# Patient Record
Sex: Male | Born: 1955 | Race: Black or African American | Hispanic: No | Marital: Married | Smoking: Former smoker
Health system: Southern US, Community
[De-identification: ages and names within clinical notes are randomized; demographics above are authoritative.]

## PROBLEM LIST (undated history)

## (undated) DIAGNOSIS — I10 Essential (primary) hypertension: Secondary | ICD-10-CM

## (undated) DIAGNOSIS — E7800 Pure hypercholesterolemia, unspecified: Secondary | ICD-10-CM

## (undated) DIAGNOSIS — K219 Gastro-esophageal reflux disease without esophagitis: Secondary | ICD-10-CM

## (undated) DIAGNOSIS — Z9889 Other specified postprocedural states: Secondary | ICD-10-CM

## (undated) DIAGNOSIS — J449 Chronic obstructive pulmonary disease, unspecified: Secondary | ICD-10-CM

## (undated) DIAGNOSIS — J40 Bronchitis, not specified as acute or chronic: Secondary | ICD-10-CM

## (undated) HISTORY — DX: Other specified postprocedural states: Z98.890

## (undated) HISTORY — PX: OTHER SURGICAL HISTORY: SHX169

---

## 2002-06-23 ENCOUNTER — Encounter: Payer: Self-pay | Admitting: Emergency Medicine

## 2002-06-23 ENCOUNTER — Emergency Department (HOSPITAL_COMMUNITY): Admission: EM | Admit: 2002-06-23 | Discharge: 2002-06-23 | Payer: Self-pay | Admitting: Emergency Medicine

## 2002-11-10 ENCOUNTER — Emergency Department (HOSPITAL_COMMUNITY): Admission: EM | Admit: 2002-11-10 | Discharge: 2002-11-10 | Payer: Self-pay | Admitting: Emergency Medicine

## 2002-11-10 ENCOUNTER — Encounter: Payer: Self-pay | Admitting: Emergency Medicine

## 2004-09-20 ENCOUNTER — Inpatient Hospital Stay (HOSPITAL_COMMUNITY): Admission: EM | Admit: 2004-09-20 | Discharge: 2004-09-23 | Payer: Self-pay | Admitting: *Deleted

## 2006-05-02 ENCOUNTER — Inpatient Hospital Stay (HOSPITAL_COMMUNITY): Admission: EM | Admit: 2006-05-02 | Discharge: 2006-05-04 | Payer: Self-pay | Admitting: Emergency Medicine

## 2006-07-22 ENCOUNTER — Inpatient Hospital Stay (HOSPITAL_COMMUNITY): Admission: EM | Admit: 2006-07-22 | Discharge: 2006-07-24 | Payer: Self-pay | Admitting: Emergency Medicine

## 2006-10-22 ENCOUNTER — Inpatient Hospital Stay (HOSPITAL_COMMUNITY): Admission: EM | Admit: 2006-10-22 | Discharge: 2006-10-25 | Payer: Self-pay | Admitting: Emergency Medicine

## 2006-11-12 ENCOUNTER — Inpatient Hospital Stay (HOSPITAL_COMMUNITY): Admission: EM | Admit: 2006-11-12 | Discharge: 2006-11-14 | Payer: Self-pay | Admitting: Emergency Medicine

## 2007-01-27 ENCOUNTER — Inpatient Hospital Stay (HOSPITAL_COMMUNITY): Admission: EM | Admit: 2007-01-27 | Discharge: 2007-01-29 | Payer: Self-pay | Admitting: Emergency Medicine

## 2010-03-12 ENCOUNTER — Inpatient Hospital Stay (HOSPITAL_COMMUNITY): Admission: EM | Admit: 2010-03-12 | Discharge: 2010-03-14 | Payer: Self-pay | Source: Home / Self Care

## 2010-04-04 ENCOUNTER — Ambulatory Visit (HOSPITAL_COMMUNITY)
Admission: RE | Admit: 2010-04-04 | Discharge: 2010-04-04 | Payer: Self-pay | Source: Home / Self Care | Attending: Internal Medicine | Admitting: Internal Medicine

## 2010-05-20 ENCOUNTER — Emergency Department (HOSPITAL_COMMUNITY): Payer: Medicare Other

## 2010-05-20 ENCOUNTER — Emergency Department (HOSPITAL_COMMUNITY)
Admission: EM | Admit: 2010-05-20 | Discharge: 2010-05-20 | Disposition: A | Discharge status: Home / Self Care | Payer: Medicare Other | Attending: Emergency Medicine | Admitting: Emergency Medicine

## 2010-05-20 DIAGNOSIS — F17200 Nicotine dependence, unspecified, uncomplicated: Secondary | ICD-10-CM | POA: Insufficient documentation

## 2010-05-20 DIAGNOSIS — J449 Chronic obstructive pulmonary disease, unspecified: Secondary | ICD-10-CM | POA: Insufficient documentation

## 2010-05-20 DIAGNOSIS — J4489 Other specified chronic obstructive pulmonary disease: Secondary | ICD-10-CM | POA: Insufficient documentation

## 2010-05-20 LAB — DIFFERENTIAL
Basophils Absolute: 0.1 10*3/uL (ref 0.0–0.1)
Basophils Relative: 1 % (ref 0–1)
Eosinophils Absolute: 1.2 10*3/uL — ABNORMAL HIGH (ref 0.0–0.7)
Eosinophils Relative: 20 % — ABNORMAL HIGH (ref 0–5)
Lymphocytes Relative: 30 % (ref 12–46)
Lymphs Abs: 1.7 10*3/uL (ref 0.7–4.0)
Monocytes Absolute: 0.6 10*3/uL (ref 0.1–1.0)
Monocytes Relative: 10 % (ref 3–12)
Neutro Abs: 2.2 10*3/uL (ref 1.7–7.7)
Neutrophils Relative %: 39 % — ABNORMAL LOW (ref 43–77)

## 2010-05-20 LAB — CBC
HCT: 41 % (ref 39.0–52.0)
Hemoglobin: 14 g/dL (ref 13.0–17.0)
MCH: 30 pg (ref 26.0–34.0)
MCHC: 34.1 g/dL (ref 30.0–36.0)
MCV: 87.8 fL (ref 78.0–100.0)
Platelets: 240 10*3/uL (ref 150–400)
RBC: 4.67 MIL/uL (ref 4.22–5.81)
RDW: 13.8 % (ref 11.5–15.5)
WBC: 5.7 10*3/uL (ref 4.0–10.5)

## 2010-05-29 ENCOUNTER — Emergency Department (HOSPITAL_COMMUNITY)
Admission: EM | Admit: 2010-05-29 | Discharge: 2010-05-29 | Disposition: A | Discharge status: Home / Self Care | Payer: Medicare Other | Attending: Emergency Medicine | Admitting: Emergency Medicine

## 2010-05-29 DIAGNOSIS — R0609 Other forms of dyspnea: Secondary | ICD-10-CM | POA: Insufficient documentation

## 2010-05-29 DIAGNOSIS — J449 Chronic obstructive pulmonary disease, unspecified: Secondary | ICD-10-CM | POA: Insufficient documentation

## 2010-05-29 DIAGNOSIS — J4489 Other specified chronic obstructive pulmonary disease: Secondary | ICD-10-CM | POA: Insufficient documentation

## 2010-05-29 DIAGNOSIS — R05 Cough: Secondary | ICD-10-CM | POA: Insufficient documentation

## 2010-05-29 DIAGNOSIS — R0602 Shortness of breath: Secondary | ICD-10-CM | POA: Insufficient documentation

## 2010-05-29 DIAGNOSIS — Y92009 Unspecified place in unspecified non-institutional (private) residence as the place of occurrence of the external cause: Secondary | ICD-10-CM | POA: Insufficient documentation

## 2010-05-29 DIAGNOSIS — R059 Cough, unspecified: Secondary | ICD-10-CM | POA: Insufficient documentation

## 2010-05-29 DIAGNOSIS — R0989 Other specified symptoms and signs involving the circulatory and respiratory systems: Secondary | ICD-10-CM | POA: Insufficient documentation

## 2010-05-29 DIAGNOSIS — J705 Respiratory conditions due to smoke inhalation: Secondary | ICD-10-CM | POA: Insufficient documentation

## 2010-05-29 DIAGNOSIS — X001XXA Exposure to smoke in uncontrolled fire in building or structure, initial encounter: Secondary | ICD-10-CM | POA: Insufficient documentation

## 2010-06-05 LAB — CBC
HCT: 34.4 % — ABNORMAL LOW (ref 39.0–52.0)
HCT: 41.3 % (ref 39.0–52.0)
Hemoglobin: 12.2 g/dL — ABNORMAL LOW (ref 13.0–17.0)
Hemoglobin: 14.8 g/dL (ref 13.0–17.0)
MCH: 31.3 pg (ref 26.0–34.0)
MCH: 32 pg (ref 26.0–34.0)
MCHC: 35.5 g/dL (ref 30.0–36.0)
MCHC: 35.8 g/dL (ref 30.0–36.0)
MCV: 88.2 fL (ref 78.0–100.0)
MCV: 89.4 fL (ref 78.0–100.0)
Platelets: 183 10*3/uL (ref 150–400)
Platelets: 260 10*3/uL (ref 150–400)
RBC: 3.9 MIL/uL — ABNORMAL LOW (ref 4.22–5.81)
RBC: 4.62 MIL/uL (ref 4.22–5.81)
RDW: 13 % (ref 11.5–15.5)
RDW: 13 % (ref 11.5–15.5)
WBC: 11.8 10*3/uL — ABNORMAL HIGH (ref 4.0–10.5)
WBC: 8.3 10*3/uL (ref 4.0–10.5)

## 2010-06-05 LAB — DIFFERENTIAL
Basophils Absolute: 0 10*3/uL (ref 0.0–0.1)
Basophils Absolute: 0.1 10*3/uL (ref 0.0–0.1)
Basophils Relative: 0 % (ref 0–1)
Basophils Relative: 2 % — ABNORMAL HIGH (ref 0–1)
Eosinophils Absolute: 0 10*3/uL (ref 0.0–0.7)
Eosinophils Absolute: 1.6 10*3/uL — ABNORMAL HIGH (ref 0.0–0.7)
Eosinophils Relative: 0 % (ref 0–5)
Eosinophils Relative: 19 % — ABNORMAL HIGH (ref 0–5)
Lymphocytes Relative: 36 % (ref 12–46)
Lymphocytes Relative: 7 % — ABNORMAL LOW (ref 12–46)
Lymphs Abs: 0.8 10*3/uL (ref 0.7–4.0)
Lymphs Abs: 3 10*3/uL (ref 0.7–4.0)
Monocytes Absolute: 0.5 10*3/uL (ref 0.1–1.0)
Monocytes Absolute: 0.8 10*3/uL (ref 0.1–1.0)
Monocytes Relative: 6 % (ref 3–12)
Monocytes Relative: 7 % (ref 3–12)
Neutro Abs: 10.2 10*3/uL — ABNORMAL HIGH (ref 1.7–7.7)
Neutro Abs: 3.1 10*3/uL (ref 1.7–7.7)
Neutrophils Relative %: 37 % — ABNORMAL LOW (ref 43–77)
Neutrophils Relative %: 86 % — ABNORMAL HIGH (ref 43–77)

## 2010-06-05 LAB — BASIC METABOLIC PANEL
BUN: 10 mg/dL (ref 6–23)
BUN: 13 mg/dL (ref 6–23)
CO2: 22 mEq/L (ref 19–32)
CO2: 24 mEq/L (ref 19–32)
Calcium: 9 mg/dL (ref 8.4–10.5)
Calcium: 9.5 mg/dL (ref 8.4–10.5)
Chloride: 101 mEq/L (ref 96–112)
Chloride: 109 mEq/L (ref 96–112)
Creatinine, Ser: 1.08 mg/dL (ref 0.4–1.5)
Creatinine, Ser: 1.22 mg/dL (ref 0.4–1.5)
GFR calc non Af Amer: >60 mL/min (ref >60)
GFR calc non Af Amer: >60 mL/min (ref >60)
Glucose, Bld: 127 mg/dL — ABNORMAL HIGH (ref 70–99)
Glucose, Bld: 76 mg/dL (ref 70–99)
Potassium: 3.8 mEq/L (ref 3.5–5.1)
Potassium: 4 mEq/L (ref 3.5–5.1)
Sodium: 138 mEq/L (ref 135–145)
Sodium: 139 mEq/L (ref 135–145)

## 2010-06-05 LAB — POCT CARDIAC MARKERS
CKMB, poc: 1.5 ng/mL (ref 1.0–8.0)
Myoglobin, poc: 47 ng/mL (ref 12–200)
Troponin i, poc: <0.05 ng/mL (ref 0.00–0.09)

## 2010-06-05 LAB — BLOOD GAS, ARTERIAL
Acid-base deficit: 0.9 mmol/L (ref 0.0–2.0)
Bicarbonate: 23.8 mEq/L (ref 20.0–24.0)
FIO2: 0.4 %
O2 Saturation: 97.6 %
Patient temperature: 37
TCO2: 20.9 mmol/L (ref 0–100)
pCO2 arterial: 43.4 mmHg (ref 35.0–45.0)
pH, Arterial: 7.358 (ref 7.350–7.450)
pO2, Arterial: 104 mmHg — ABNORMAL HIGH (ref 80.0–100.0)

## 2010-06-05 LAB — CARBOXYHEMOGLOBIN
Carboxyhemoglobin: 1.9 % — ABNORMAL HIGH (ref 0.5–1.5)
Methemoglobin: 1 % (ref 0.0–1.5)
O2 Saturation: 94.9 %
Total hemoglobin: 14.5 g/dL (ref 13.5–18.0)
Total oxygen content: 18.8 mL/dL (ref 15.0–23.0)

## 2010-07-20 ENCOUNTER — Ambulatory Visit (HOSPITAL_COMMUNITY)
Admission: AD | Admit: 2010-07-20 | Discharge: 2010-07-21 | Disposition: A | Discharge status: Home / Self Care | Payer: Medicare Other | Source: Ambulatory Visit | Attending: Internal Medicine | Admitting: Internal Medicine

## 2010-07-20 DIAGNOSIS — K222 Esophageal obstruction: Secondary | ICD-10-CM | POA: Insufficient documentation

## 2010-07-20 DIAGNOSIS — T18108A Unspecified foreign body in esophagus causing other injury, initial encounter: Secondary | ICD-10-CM | POA: Insufficient documentation

## 2010-07-20 DIAGNOSIS — IMO0002 Reserved for concepts with insufficient information to code with codable children: Secondary | ICD-10-CM | POA: Insufficient documentation

## 2010-07-21 DIAGNOSIS — T18108A Unspecified foreign body in esophagus causing other injury, initial encounter: Secondary | ICD-10-CM

## 2010-07-21 DIAGNOSIS — R1310 Dysphagia, unspecified: Secondary | ICD-10-CM

## 2010-07-21 DIAGNOSIS — IMO0002 Reserved for concepts with insufficient information to code with codable children: Secondary | ICD-10-CM

## 2010-07-21 DIAGNOSIS — K222 Esophageal obstruction: Secondary | ICD-10-CM

## 2010-07-21 DIAGNOSIS — Z9889 Other specified postprocedural states: Secondary | ICD-10-CM

## 2010-07-21 HISTORY — DX: Other specified postprocedural states: Z98.890

## 2010-07-25 ENCOUNTER — Encounter: Payer: Self-pay | Admitting: Internal Medicine

## 2010-07-25 NOTE — Op Note (Signed)
  NAME:  [name], Sankalp                  ACCOUNT NO.:  [id number]  MEDICAL RECORD NO.:  [id number]           PATIENT TYPE:  O  LOCATION:  DAYP                          FACILITY:  [facility]  PHYSICIAN:  R. Roetta Sessions, M.D. DATE OF BIRTH:  [birth year]  DATE OF PROCEDURE:  07/21/2010 DATE OF DISCHARGE:                              OPERATIVE REPORT   INDICATIONS FOR PROCEDURE:  A 55 year old gentleman with intermittent esophageal dysphagia to solids, ate some meat at approximately 2200 on July 20, 2010, beginning of a late evening meal.  He felt the meat bolus stick.  He was unable to swallow anything including saliva.  Since that time he presented to the ED, he saw Dr. Colon Branch.  She gave him 1 mg of glucagon IV.  This did not help.  She called me.  EGD is now being done for probable food impaction.  Risks, benefits, limitations, alternatives, and imponderables have been discussed, questions answered. Please see the documentation in the medical record.  He has had no prior GI evaluation for his dysphagia symptoms.  PROCEDURE NOTE:  O2 saturation, blood pressure, pulse, and respirations were monitored throughout entire procedure.  CONSCIOUS SEDATION:  Versed 2 mg IV, Demerol 50 mg IV in single dose. Cetacaine spray gargled and sparyed.  INSTRUMENT:  Pentax video chip system.  FINDINGS:  Examination of tubular esophagus revealed a meat bolus ball valving distal esophagus.  I approximated the gastroscope against the bolus and gently applied pressure and it easily popped into the stomach. It was impacted against Schatzki ring.  Complete examination was not attempted and the patient tolerated brief procedure very well.  IMPRESSION:  Emergency esophagogastroduodenoscopy with removal of esophageal food impaction as described above.  Underlying lesion appeared to be a Schatzki ring.  Exam limited as described above.  RECOMMENDATIONS: 1. Soft diet.  Avoid meat for now. 2. Begin Protonix 40 mg  orally daily. 3. Plan to bring him back in the next 1-2 weeks for elective EGD with     dilation, etc. as appropriate.     Jonathon Bellows, M.D.     RMR/MEDQ  D:  07/21/2010  T:  07/21/2010  Job:  [id number]  cc:   Nicoletta Dress. Colon Branch, M.D. Fax: [phone]  Electronically Signed by Lorrin Goodell M.D. on 07/25/2010 07:52:13 PM

## 2010-08-08 ENCOUNTER — Encounter: Payer: Self-pay | Admitting: Gastroenterology

## 2010-08-08 ENCOUNTER — Ambulatory Visit (INDEPENDENT_AMBULATORY_CARE_PROVIDER_SITE_OTHER): Payer: Medicare Other | Admitting: Gastroenterology

## 2010-08-08 VITALS — BP 147/91 | HR 79 | Temp 98.9°F | Ht 70.0 in | Wt 139.8 lb

## 2010-08-08 DIAGNOSIS — R1310 Dysphagia, unspecified: Secondary | ICD-10-CM | POA: Insufficient documentation

## 2010-08-08 DIAGNOSIS — Z1211 Encounter for screening for malignant neoplasm of colon: Secondary | ICD-10-CM | POA: Insufficient documentation

## 2010-08-08 DIAGNOSIS — Z1212 Encounter for screening for malignant neoplasm of rectum: Secondary | ICD-10-CM

## 2010-08-08 MED ORDER — PEG 3350-KCL-NA BICARB-NACL 420 G PO SOLR: ORAL | Status: AC

## 2010-08-08 NOTE — H&P (Signed)
NAME:  [name], [name]                  ACCOUNT NO.:  [id number]   MEDICAL RECORD NO.:  [id number]          PATIENT TYPE:  INP   LOCATION:  A326                          FACILITY:  [facility]   PHYSICIAN:  Marcello Moores, MD   DATE OF BIRTH:  1955/11/21   DATE OF ADMISSION:  11/12/2006  DATE OF DISCHARGE:  LH                              HISTORY & PHYSICAL   PMD:  Madelin Rear. [name], MD   CHIEF COMPLAINT:  Shortness of breath.   HISTORY OF PRESENT ILLNESS:  [name] is a 55 year old man with a known  history of bronchial asthma admitted repeatedly with bronchial asthma  attacks, and he was discharged 3 weeks ago from this hospital with the  same problem improved, and he came back with shortness of breath.  Since  he is discharged he did not see his physician, PMD, because of his  insurance-related issues.  Otherwise, he denied any fever, no  significant cough, no chest pain.  He had the shortness of breath  worsened the last 2 days and he was taking Advair Diskus at home and is  still he is smoking.  Last time when he was discharged, he was  discharged with albuterol inhaler, Advair and Singulair, but he stated  that he was taking only Advair.   Dictation ended at this point.      Marcello Moores, MD  Electronically Signed     MT/MEDQ  D:  11/12/2006  T:  11/13/2006  Job:  [id number]

## 2010-08-08 NOTE — H&P (Signed)
NAME:  [name], [name]                  ACCOUNT NO.:  [id number]   MEDICAL RECORD NO.:  [id number]          PATIENT TYPE:  INP   LOCATION:  A326                          FACILITY:  [facility]   PHYSICIAN:  Marcello Moores, MD   DATE OF BIRTH:  1955-07-15   DATE OF ADMISSION:  11/12/2006  DATE OF DISCHARGE:  LH                              HISTORY & PHYSICAL   PRIMARY CARE PHYSICIAN:  The patient's primary medical doctor is  Madelin Rear. [name], M.D.   CHIEF COMPLAINT:  Shortness of breath.   HISTORY OF THE PRESENT ILLNESS:  [name] patient with  a known history of bronchial asthma who came in with shortness of  breath.  The patient has chronic bronchial asthma.  He has been admitted  repeatedly for that with asthmatic attacks.  He was discharged from this  hospital for the same problem from which improved on October 25, 2006.  He was not able to visit his physician after discharge because of his  insurance-related issues.  The patient states that he has shortness of  breath and denies any significant cough.  He does not have any chest  pain, palpitations and/or  fever.  The patient was discharged on  Advair, albuterol inhaler and Singulair; however, he was only using  Advair per the patient.  The patient admits that he is still smoking.   REVIEW OF SYSTEMS:  A 10-point review of systems is noncontributory.  He  denies any abdominal complaints.  No urinary complaints.  He has no  headaches.   ALLERGIES:  No known drug allergies.   SOCIAL HISTORY:  The patient is a chronic smoker.  He denies any drug  abuse.  He drinks occasionally.  He states that he is not working  because he is on disability for his bronchial asthma.   PAST MEDICAL HISTORY:  1. Bronchial asthma.  2. Polysubstance abuse including tobacco use.   MEDICATIONS:  Home medication list; he was discharged on:  1. Advair  5/250 one puff twice a day.  2. Albuterol inhaler 1 puff every 6 hours.  3. Singulair 10  mg by mouth daily.   PHYSICAL EXAMINATION:  GENERAL APPEARANCE:  The patient is lying in bed  and is in mild respiratory distress.  VITAL SIGNS:  Temperature is 98.6 and pulse is 74.  Respiratory rate 18.  Blood pressure 117/79 and saturation is 96% on room air.  HEENT:  Pink conjunctivae.  Nonicteric sclerae.  NECK:  The neck is supple.  CHEST:  The patient has good air entry bilaterally with diffuse rhonchi  and wheezes; however, he is not using his accessory muscles now.  HEART:  Cardiovascular -  S1, S2, and regular rate and rhythm are heard.  No murmur.  ABDOMEN:  The abdomen is soft.  There is no area of tenderness.  Normal  bowel sounds.  EXTREMITIES:  The extremities reveal no pedal edema.  NEUROLOGIC EXAMINATION:  Central nervous system: He is alert and well-  oriented.   LABORATORY DATA:  CBC; white blood cells 6.9, hemoglobin is 12.8,  hematocrit is 37 and platelet count is 181,000.  Chemistries reveal  sodium of 139, potassium 3.8, chloride 111, glucose 124, BUN 9,  creatinine 0.8, and calcium 8.7.  The chest x-ray showed peribronchial  thickening and a new lingular density; atelectasis versus pneumonia.   ASSESSMENT:  1. Exacerbation of bronchial asthma.  The patient has known bronchial      asthma and has been repeatedly admitted for this.  We will put him      on Solu-Medrol IV, give him respiratory treatments every 4 hours      and also give him oxygen via nasal cannula as needed.  2. Questionable pneumonia with chest x-ray findings of a lingular      density; atelectasis versus pneumonia.  We will put him on an      antibiotic.  We will do a computerized axial tomographic scan of      the chest before we discharge him to evaluate the peribronchial      thickening as well as the new density.  3. Otherwise the patient has been educated about smoking cessation.      He looks like he understands this; however, he might need further      education while he is in the  hospital.   Otherwise currently the has patient responded well and might be able to  be discharged in the coming next 2 days.      Marcello Moores, MD  Electronically Signed     MT/MEDQ  D:  11/12/2006  T:  11/13/2006  Job:  [id number]

## 2010-08-08 NOTE — Assessment & Plan Note (Signed)
Last colonoscopy >10 years ago. Pt is unsure where performed or results. No abdominal pain, change in bowel habits, or rectal bleeding. Will proceed with average risk screening colonoscopy at time of EGD.   Proceed with TCS with Dr. Jena Gauss in near future: the risks, benefits, and alternatives have been discussed with the patient in detail. The patient states understanding and desires to proceed. Plan to perform procedure in OR secondary to marijuana use. Will utilize anesthesia assistance for deep sedation under Propofol. Pt instructed to abstain from marijuana.

## 2010-08-08 NOTE — Assessment & Plan Note (Signed)
Intermittent dysphagia reported in past with most recent food impaction end of April requiring emergent EGD; likely due to Schatzki's ring, non-manipulated. Needs follow-up EGD with likely dilation electively. Pt admits to using marijuana several times per week. Will proceed with EGD/ED with the assistance of anesthesia, in the OR, secondary to marijuana use. The R/B/A have been discussed in detail with pt; he states understanding. He was instructed to abstain from marijuana; he agrees to this.  EGD/ED with Dr. Jena Gauss in next few weeks Protonix daily (continue) Avoid tough textures

## 2010-08-08 NOTE — Discharge Summary (Signed)
NAME:  [name], [name]                  ACCOUNT NO.:  [id number]   MEDICAL RECORD NO.:  [id number]          PATIENT TYPE:  INP   LOCATION:  A202                          FACILITY:  [facility]   PHYSICIAN:  Marcello Moores, MD   DATE OF BIRTH:  30-Dec-1955   DATE OF ADMISSION:  10/22/2006  DATE OF DISCHARGE:  08/01/2008LH                               DISCHARGE SUMMARY   ADDENDUM:  Please send copy of discharge summary to [employer]. [name],  M.D.      Marcello Moores, MD  Electronically Signed     MT/MEDQ  D:  10/25/2006  T:  10/25/2006  Job:  [id number]   cc:   Madelin Rear. [name], MD  Fax: [phone]

## 2010-08-08 NOTE — Discharge Summary (Signed)
NAME:  [name], [name]                  ACCOUNT NO.:  [id number]   MEDICAL RECORD NO.:  [id number]          PATIENT TYPE:  INP   LOCATION:  A326                          FACILITY:  [facility]   PHYSICIAN:  Osvaldo Shipper, MD     DATE OF BIRTH:  [birth year]   DATE OF ADMISSION:  11/12/2006  DATE OF DISCHARGE:  LH                               DISCHARGE SUMMARY   PRIMARY CARE PHYSICIAN:  Dr. [name] from [facility]   DISCHARGE DIAGNOSIS:  Acute asthmatic exacerbation, improved.   Please see H&P dictated by Dr. Benson Setting for details regarding the  patient's presenting illness.   BRIEF HOSPITAL COURSE:  Briefly, this is a 55 year old African American  man who has a history of asthma who came in with shortness of breath.  He was found to be in acute asthmatic exacerbation and was admitted for  inpatient management.  He was given nebulizer treatment, steroids,  antibiotics, and he is significantly improved.  He was here just a  couple of weeks ago and was discharged.  However, he has not been able  to afford his medications.  This issue is not very clear because he does  have insurance.  He probably spends all his money on smoking.  I  emphasized to him that we will not be able to provide any financial  assistance because he does have insurance.  He was told to spend his  money not on cigarettes but to buy his medications.  He was also told  that he may be able to avoid a large co-pay by going to [employer].   DISCHARGE MEDICATIONS:  1. Prednisone 60 mg for 5 days, 40 mg for 5 days, 20 mg for 5 days, 10      mg for 5 days, and then quit.  2. Singulair 10 mg daily.  3. Albuterol nebulizer 2.5 mg every 6 hours as needed.  4. Atrovent nebulizer 0.5 mg every 6 hours as needed.  5. Advair to be continued b.i.d.  6. Nicotine patch 21 mg per day.   He was emphasized to quit smoking.   FOLLOWUP:  With Dr. [name] 1-2 weeks.   DIET AND PHYSICAL ACTIVITY:  No restrictions.   Total time spent  at discharge 35 minutes.      Osvaldo Shipper, MD  Electronically Signed     GK/MEDQ  D:  11/14/2006  T:  11/14/2006  Job:  [id number]   cc:   Madelin Rear. [name], MD  Fax: [phone]

## 2010-08-08 NOTE — H&P (Signed)
NAME:  [name], [name]                  ACCOUNT NO.:  [id number]   MEDICAL RECORD NO.:  [id number]          PATIENT TYPE:  INP   LOCATION:  A218                          FACILITY:  [facility]   PHYSICIAN:  Osvaldo Shipper, MD     DATE OF BIRTH:  1955-06-06   DATE OF ADMISSION:  01/27/2007  DATE OF DISCHARGE:  LH                              HISTORY & PHYSICAL   The patient used to be seen by Dr. [name] but he has been discharged from  their practice.   ADMISSION DIAGNOSES:  1. Acute respiratory failure.  2. Acute asthma exacerbation.  3. History of tobacco abuse, though currently a nonsmoker.   CHIEF COMPLAINT:  Acute shortness of breath and respiratory distress.   HISTORY:  The patient is a 55 year old African American male who has a  past medical history of asthma who has been noncompliant in the past,  but who tells me that he has been taking his medications prescribed  since the last time he was here and this was back on November 14, 2006.  He says that since this past Friday he has been having difficulty  breathing and has been having a cough with no expectoration.  These  symptoms got progressively worse and then he became acutely short of  breath earlier tonight and had to be brought in by EMS and then was  placed on BiPAP.  The patient was given nebulizer treatments, was put on  BiPAP and he has improved since then, but he is still pretty short of  breath.  Denies any chest pain.  Just mentioning some pain in the flank  area because of cough.  Otherwise denies any fever or any sick contacts.   MEDICATIONS AT HOME:  1. He has been using albuterol inhaler.  He says he is on an albuterol      nebulizer and Atrovent nebulizer every six hours.  2. Singulair 10 mg daily.  3. Advair 250/50 b.i.d.   ALLERGIES:  He said he developed allergy to NICOTINE PATCH the last time  it was prescribed. Otherwise he does not have any drug allergies.  The  allergy to the nicotine patch is manifested in  the form of a skin rash.   PAST MEDICAL HISTORY:  1. Positive for asthma for 30 years.  2. History of occasional high blood pressure but he is not on any      treatment for the same.   SOCIAL HISTORY:  Lives in [place] with his wife.  He currently is  unemployed.  He quit smoking November 11, 2006.  No drug use.  Occasional  beer intake is present.   FAMILY HISTORY:  Father has asthma.  Lucila Maine has asthma. Otherwise  unremarkable.   REVIEW OF SYSTEMS:  GENERAL:  Positive for weakness.  HEENT:  Unremarkable.  CARDIOVASCULAR:  Unremarkable.  RESPIRATORY:  As in HPI.  GI:  Unremarkable.  GU:  Unremarkable.  MUSCULOSKELETAL:  Unremarkable.  PSYCHIATRIC:  Unremarkable.  NEUROLOGIC:  Unremarkable.  DERMATOLOGIC:  Positive for still evidence for some itching and rash area in the right  thigh area.  ENDOCRINE:  Unremarkable.  OTHER SYSTEMS:  Unremarkable.   PHYSICAL EXAMINATION:  VITAL SIGNS:  Temperature 98.4. Blood pressure  initially when he came in was 196/98 and 131/88.  Heart rate 110 to 120,  regular.  Respiratory rate 28.  Oxygen saturation initially was at 100%  on BiPAP.  GENERAL:  The patient is a thin African American male in no apparent  distress at this time.  Slightly tachypneic.  HEENT:  No pallor, no icterus.  Oral mucous membranes moist.  No oral  lesions were seen.  NECK:  Soft and supple.  No thyromegaly is appreciated.  LUNGS:  Diffuse bilateral end-expiratory wheezing.  No crackles  appreciated.  CARDIOVASCULAR:  S1 and S2 normal, regular.  No murmurs appreciated.  ABDOMEN:  Soft, nontender, nondistended.  Bowel sounds present.  No mass  or organomegaly appreciated.  EXTREMITIES:  No edema.  Peripheral pulses are palpable.  NEUROLOGIC:  Alert and oriented x3.  No focal neurological deficits are  present.   Chest x-ray showed COPD, emphysema, bronchitic changes, left lower lobe  atelectasis present.  No focal findings pulmonary consolidation noted.   LABORATORY  DATA:  He had ABG on presentation which showed evidence for  respiratory acidosis with pH of 7.24, pCO2 57, pO2 201.  His subsequent  ABG showed a pH of 7.34, pCO2 40.  His CBC was otherwise pretty  unremarkable.  He does have some prominent eosinophils of 20% on his  differential. CMET shows a glucose of 131; otherwise unremarkable.   ASSESSMENT:  This is a 55 year old African American male with history of  asthma presents with acute onset of shortness of breath and was found to  be in respiratory failure because of acute asthma.  He has improved a  little bit.  I think he still needs to be closely monitored.   PLAN:  1. Acute respiratory failure as a result of asthma exacerbation.  He      will continue the BiPAP overnight and discontinue tomorrow morning.      Recheck an ABG one hour later.  In the meantime I will continue      giving him nebulizer treatments.  Start him on steroids and      antibiotics.  Continue the Advair and Singulair.  Monitored peak      flows at least once daily.  2. Deep venous thrombosis prophylaxis will be initiated.  3. Anticipate the patient's stay in the hospital two to three days.   Please also note the patient will need a new PMD upon discharge.      Osvaldo Shipper, MD  Electronically Signed    GK/MEDQ  D:  01/27/2007  T:  01/27/2007  Job:  [id number]   cc:   Madelin Rear. [name], MD  Fax: [phone]

## 2010-08-08 NOTE — Discharge Summary (Signed)
[name], [name]                  ACCOUNT NO.:  [id number]   MEDICAL RECORD NO.:  [id number]          PATIENT TYPE:  INP   LOCATION:  A218                          FACILITY:  [facility]   PHYSICIAN:  Osvaldo Shipper, MD     DATE OF BIRTH:  1955/11/05   DATE OF ADMISSION:  01/26/2007  DATE OF DISCHARGE:  11/05/2008LH                               DISCHARGE SUMMARY   Please review H&P dictated at the time of admission for details  regarding the patient's presenting illness.   DISCHARGE DIAGNOSES:  1. Acute asthma exacerbation, improved.  2. Acute respiratory failure, resolved.   HOSPITAL COURSE:  Briefly this is a 55 year old African-American male  who has a history of asthma since childhood, who presented with acute  shortness of breath and was found to be in respiratory failure when he  was in the emergency department.  His pH was 7.24 with a pCO2 of 57.  He  mentioned that he had not smoked since August of 2008.  In any case, the  patient was put on a BiPAP initially which was taken off the following  day.  He was given nebulizer treatments and steroids.  He slowly and  gradually improved.  He told me that he was compliant with his  medications.  He told me that he was visiting his daughter and thats  when his breathing started getting worse.  He did not have any kind of  inhaler to take and he had to go to his home which took him a while and  then he got worse.   PHYSICAL EXAMINATION:  GENERAL:  Today the patient is feeling better.  He has a cough, but he has been ambulating with no difficulty.  VITAL SIGNS:  His vital signs are stable with saturation 97% on room  air.  LUNGS:  Still show some wheezing bilaterally, but much improved from  earlier.  Otherwise the remainder of this examination is unremarkable.   The patient is improved, he is ambulating.  I think he can continue the  rest of the treatment at home.  He is no longer in respiratory failure.   FOLLOWUP:  Unfortunately,  Madelin Rear. [name], M.D. has discharged this  patient from his clinic due to some financial issues.  The patient has  an appointment with the [facility] on December 2.  I  have told him to call them to see if they can see him earlier or not.   DISCHARGE MEDICATIONS:  1. Albuterol nebulizer every 6 hours.  2. Advair inhaled 250/50 twice daily.  3. Singulair 10 mg p.o. daily.  4. Albuterol inhaler as needed q.4 hours to be carried with him      whenever he goes out of home.  5. Prednisone 60 mg for four days and 40 mg for four days and then 20      mg for four days and then 10 mg for four days.   DIET:  Heart healthy.   ACTIVITY:  No restrictions.      Osvaldo Shipper, MD  Electronically Signed  GK/MEDQ  D:  01/29/2007  T:  01/29/2007  Job:  [id number]

## 2010-08-08 NOTE — Group Therapy Note (Signed)
NAME:  [name], [name]                  ACCOUNT NO.:  [id number]   MEDICAL RECORD NO.:  [id number]          PATIENT TYPE:  INP   LOCATION:  A202                          FACILITY:  [facility]   PHYSICIAN:  Dorris Singh, DO    DATE OF BIRTH:  1956-02-18   DATE OF PROCEDURE:  DATE OF DISCHARGE:                                 PROGRESS NOTE   The patient is a 55 year old African-American male who has been here for  exacerbation of asthma.  The patient states that he is feeling a little  bit better today.  Still wheezing but has improved and breathing a  little bit easier.   PHYSICAL EXAMINATION:  VITAL SIGNS:  Currently his vitals are as  follows:  Temperature 98.8, pulse 71, respirations 18, blood pressure  105/66.  GENERAL:  This is a 55 year old African-American male who is well-  developed, well-nourished in no acute distress.  HEART:  Regular rate and rhythm.  No murmurs noted. S1, S2 present.  LUNGS:  Inspiratory and expiratory wheezing in all lung fields.  ABDOMEN:  Soft, nontender, nondistended.  Bowel sounds in all 4  quadrants.  EXTREMITIES:  Without edema.  Peripheral pulses are palpated x4.   LABORATORY DATA:  Actually are from the twenty-ninth, there has been no  change and no labs order today.  We will order labs for him tomorrow.   ASSESSMENT:  1. Acute exacerbation of asthma.  2. History of substance abuse.   PLAN:  1. We will go ahead and continue patient on steroids nebulizing      treatments.  We will also give him the option of doing nebulizing      treatments every hour and we will continue with antibiotic therapy.  2. He was supposed to have a financial consultation, still awaiting      any recommendations for him to be able to get a nebulizer at home.  3. The patient was having problems with sleep but did not have an      Ambien and that actually relieved it.  4. There are no cardiac issues noted with the panel that was done on      the twenty-ninth and we will  continue to monitor patient and adjust      management as needed.      Dorris Singh, DO  Electronically Signed     CB/MEDQ  D:  10/23/2006  T:  10/24/2006  Job:  [id number]

## 2010-08-08 NOTE — Progress Notes (Signed)
Primary Care Physician:  Provider Not In System Primary Gastroenterologist:  Dr. Jena Gauss  Chief Complaint  Patient presents with  . EGD    HPI:  [name] is a 55 y.o. male here as a new patient for f/u after meat impaction April 27th requiring emergency endoscopy. Findings consistent with Schatzki's ring. Not dilated, limited exam; here to discuss follow-up endoscopy and possible dilation. He denies any further dysphagia at this time. Does note hx of dysphagia in the past, prior to ED presentation. Denies nausea/vomiting. No abdominal pain. Denies reflux. On Protonix 40 mg daily.  BM daily, usually in the morning. No rectal bleeding. Last colonoscopy >10 years ago, unsure where it was done or what the report was.   Past Medical History  Diagnosis Date  . Asthma   . S/P endoscopy July 21, 2010    Dr. Jena Gauss: limited endoscopy, secondary to food impaction, Schatzki's ring seen    Past Surgical History  Procedure Date  . Lipoma removal from posterior neck     Current Outpatient Prescriptions  Medication Sig Dispense Refill  . pantoprazole (PROTONIX) 40 MG tablet       . PROAIR HFA 108 (90 BASE) MCG/ACT inhaler       . SPIRIVA HANDIHALER 18 MCG inhalation capsule               Allergies as of 08/08/2010  . (No Known Allergies)    Family History  Problem Relation Age of Onset  . Bone cancer Mother     deceased in late 74s  . Aneurysm Father     deceased late 17s, brain    History   Social History  . Marital Status: Married    Spouse Name: N/A    Number of Children: N/A  . Years of Education: N/A   Occupational History  . Not on file.   Social History Main Topics  . Smoking status: Never Smoker   . Smokeless tobacco: Not on file  . Alcohol Use: Yes     6 pack a week sometime a couple of shots  . Drug Use: 2 per week    Special: Marijuana     about three times a week     Review of Systems: Gen: Denies any fever, chills, sweats, anorexia, fatigue,  weakness, malaise, weight loss, and sleep disorder CV: Denies chest pain, angina, palpitations, syncope, orthopnea, PND, peripheral edema, and claudication. Resp: Denies dyspnea at rest, dyspnea with exercise, cough, sputum, wheezing, coughing up blood, and pleurisy. GI: See HPI GU : Denies urinary burning, blood in urine, urinary frequency, urinary hesitancy, nocturnal urination, and urinary incontinence. MS: Denies joint pain, limitation of movement, and swelling, stiffness, low back pain, extremity pain. Denies muscle weakness, cramps, atrophy.  Derm: Denies rash, itching, dry skin, hives, moles, warts, or unhealing ulcers.  Psych: Denies depression, anxiety, memory loss, suicidal ideation, hallucinations, paranoia, and confusion. Heme: Denies bruising, bleeding, and enlarged lymph nodes.  Physical Exam: BP 147/91  Pulse 79  Temp(Src) 98.9 F (37.2 C) (Temporal)  Ht 5\' 10"  (1.778 m)  Wt 139 lb 12.8 oz (63.413 kg)  BMI 20.06 kg/m2 General:   Alert, thin but appears well-nourished, pleasant and cooperative in NAD Head:  Normocephalic and atraumatic. Eyes:  Sclera clear, no icterus.   Conjunctiva pink. Ears:  Normal auditory acuity. Nose:  No deformity, discharge,  or lesions. Mouth:  No deformity or lesions, dentition normal. Neck:  Supple; no masses or thyromegaly. Lungs: expiratory wheezes. No rales,  no use of accessory muscles. Breathing unlabored. Heart:  Regular rate and rhythm; no murmurs, clicks, rubs,  or gallops. Abdomen:  Soft, nontender and nondistended. No masses, hepatosplenomegaly or hernias noted. Normal bowel sounds, without guarding, and without rebound.   Rectal:  Deferred until time of colonoscopy.   Msk:  Symmetrical without gross deformities. Normal posture. Extremities:  Without clubbing or edema. Neurologic:  Alert and  oriented x4;  grossly normal neurologically. Skin:  Intact without significant lesions or rashes. Cervical Nodes:  No significant cervical  adenopathy. Psych:  Alert and cooperative. Normal mood and affect.

## 2010-08-08 NOTE — Group Therapy Note (Signed)
NAME:  [name], [name]                  ACCOUNT NO.:  [id number]   MEDICAL RECORD NO.:  [id number]          PATIENT TYPE:  INP   LOCATION:  A202                          FACILITY:  [facility]   PHYSICIAN:  Dorris Singh, DO    DATE OF BIRTH:  1956-02-06   DATE OF PROCEDURE:  DATE OF DISCHARGE:                                 PROGRESS NOTE   The patient is admitted here for acute exacerbation of asthma.  Today  the respiratory tech noted that patient has a peanut allergy and in tern  found that she could not give the patient his continued Atrovent  treatments.  The patient has been receiving Atrovent treatments since  admission and has denied any problems.  Upon further questioning of his  peanut allergy, the patient mentions that in 1983 he was diagnosed with  the peanut allergy from an allergist but since that time admits to  eating peanuts up to 1-2 times a month without any problem.  He states  that if he eats them for several days in a row he does get mild pruritus  on his arms but is relieved once he stops eating the peanuts or takes  Benadryl.  I spoke to patient regarding the risks verses benefit of the  Atrovent and he agreed that we would continue to with the Atrovent to  improve his breathing status.  If patient did have a reaction to that,  would have Benadryl ordered for him that he can use.  Due to the fact  that the patient has had Atrovent in the past as well without any issue,  it was decided with the patient's consent that we would continue giving  him the Atrovent breathing treatments.   PHYSICAL EXAMINATION:  VITAL SIGNS:  Temperature 97.1, pulse 62,  respirations 20, blood pressure 120/70.  GENERALLY:  This is a 55 year old African-American male who is well-  developed, well-nourished, in no acute distress.  HEART:  Regular rate and rhythm.  No murmur noted.  S1, S2 present.  LUNGS:  Actually are improved from yesterday's examination.  Mild  expiratory wheezing but has  improved.  ABDOMEN:  Soft, nontender, nondistended.  LEGS:  Positive peripheral pulses x4.  No edema, ecchymosis or cyanosis  noted.   LABORATORY DATA:  Did not have CBC done but will order 1 for tomorrow to  continue to monitor him and CMP at this time.   ASSESSMENT:  1. Acute exacerbation of asthma.  2. History of substance abuse.   PLAN:  1. We will continue to go ahead and keep patient on steroids and also      keep him on the Atrovent as noted before.  The patient has agreed      and consented to continue therapy.  We will also order Benadryl for      patient to use if      there is a pruritic episode.  2. The patient is continuing to improve so anticipate discharge in 1-2      days.  As [place] as patient is improved he can be discharged  tomorrow.      Dorris Singh, DO  Electronically Signed     CB/MEDQ  D:  10/24/2006  T:  10/25/2006  Job:  [id number]

## 2010-08-08 NOTE — H&P (Signed)
[name], [name]                  ACCOUNT NO.:  [id number]   MEDICAL RECORD NO.:  [id number]          PATIENT TYPE:  INP   LOCATION:  A202                          FACILITY:  [facility]   PHYSICIAN:  Osvaldo Shipper, [name]     DATE OF BIRTH:  Nov 07, 1955   DATE OF ADMISSION:  10/22/2006  DATE OF DISCHARGE:  LH                              HISTORY & PHYSICAL   The patient's primary care physician is [name]. [name], [name], from  [facility].   ADMISSION DIAGNOSES:  1. Acute asthma exacerbation.  2. History of polysubstance abuse with cocaine, marijuana and tobacco.   CHIEF COMPLAINT:  Shortness of breath since Friday.   HISTORY OF PRESENT ILLNESS:  This patient is a 55 year old African  American male who has a history of asthma, who was last admitted back in  May 2008 for asthma exacerbation.  The patient mentioned that he does  not have any of his medications any more because he is unable to afford  them.  On Friday he ran out of his medications and started feeling more  short of breath.  This progressively worsened and over the past 24  hours, he says his breathing has really gotten worse.  He also gives a  history of cough with clear expectoration.  Chest pain with cough only  sharp, 8/10.  No fever, no sick contacts at home.   MEDICATIONS AT HOME:  Advair, Proventil inhalers, and Singulair, unknown  doses.  He is on no nebulizer treatment at this point.  As mentioned  earlier, he ran out of these medications this past Friday.   ALLERGIES:  No known drug allergies.   PAST MEDICAL HISTORY:  1. Asthma for 30 years.  2. History of occasional elevated blood pressure but currently not on      any treatment.   SOCIAL HISTORY:  Lives in [place] with his wife and family.  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   He smokes occasionally according to him, not on a daily basis.  Occasional alcohol consumption.  Did admit to using marijuana  4 days ago  and cocaine 1 week ago.  No IV drug use.   FAMILY HISTORY:  Father had asthma.  Lucila Maine has asthma.  Otherwise  unremarkable.   REVIEW OF SYSTEMS:  HEENT:  Unremarkable.  CARDIOVASCULAR:  As in HPI.  RESPIRATORY:  As in HPI.  GI:  Unremarkable.  GU:  Unremarkable.  MUSCULOSKELETAL:  Unremarkable.  PSYCHIATRIC:  Unremarkable.  NEUROLOGIC:  Unremarkable.  DERMATOLOGIC:  Unremarkable.  ENDOCRINE:  Unremarkable.  Other systems negative.   PHYSICAL EXAMINATION:  VITAL SIGNS:  His temperature was 98.2 in the ED.  Blood pressure initially was 193/133, then 102/63, heart rate in the  80s, respiratory rate 24, saturation 96% on room air.  GENERAL EXAM:  This is a thin black male in no distress, in some  discomfort.  HEENT:  There is no pallor, no icterus.  Oral mucous membrane is moist.  No oral lesions are noted.  NECK:  Soft and supple.  No thyromegaly is appreciated.  LUNGS:  Bilateral diffuse end-expiratory wheezing with no crackles.  CARDIOVASCULAR:  S1, S2 is normal, regular.  No murmur is appreciated.  No S3, S4.  No bruits heard.  ABDOMEN:  Soft, nontender, nondistended.  Bowel sounds are present.  No  mass or organomegaly is appreciated.  EXTREMITIES:  Without edema.  Peripheral pulses are palpable.  NEUROLOGIC:  The patient is alert and oriented x3.  No focal neurologic  deficits are present.   ABG is normal.  CBC unremarkable except for mild neutrophilia.  BMET is  remarkable just for mildly elevated glucose at 121.  Chest x-ray did not  show any acute cardiopulmonary process.   ASSESSMENT AND PLAN:  This is a 55 year old African American male with a  history of asthma, who presents with shortness of breath since Friday  and clearly seems to have asthma exacerbation.   PLAN:  1. Acute asthma exacerbation.  We will treat him with steroids,      nebulizer treatments.  We will also give him antibiotics because of      his cough.  GI and DVT prophylaxis will be  initiated.  He will be      resumed on his Advair and Singulair.  A financial consultation will      be provided to see if his paperwork can be rushed through      disability.  2. Will check one cardiac panel just to be sure there is no cardiac      issue here, though it is unlikely.  3. Polysubstance abuse counseling has been done.  We will have the      social worker to see.  Nicotine patch as needed.   Further management decisions will be based on results of initial testing  and the patient's response to treatment.      Osvaldo Shipper, [name]  Electronically Signed     GK/MEDQ  D:  10/22/2006  T:  10/22/2006  Job:  [id number]   cc:   [name]. [name], [name]  Fax: [phone]

## 2010-08-08 NOTE — Discharge Summary (Signed)
NAME:  [name], [name]                  ACCOUNT NO.:  [id number]   MEDICAL RECORD NO.:  [id number]          PATIENT TYPE:  INP   LOCATION:  A202                          FACILITY:  [facility]   PHYSICIAN:  Marcello Moores, MD   DATE OF BIRTH:  [birth year]   DATE OF ADMISSION:  10/22/2006  DATE OF DISCHARGE:  08/01/2008LH                               DISCHARGE SUMMARY   PRIMARY MEDICAL DOCTOR:  Dr. [name]   DISCHARGING DIAGNOSES:  1. Acute exacerbation of asthma, resolving.  2. History of polysubstance abuse.   HOME MEDICATIONS:  1. Albuterol inhaler every 6 hours one puff p.r.n.  2. Advair one puff b.i.d.  3. Singulair 10 mg p.o. daily.   HOSPITAL COURSE:  Mr. Bonet is 55 year old male patient who was admitted  on October 22, 2006, after he presented to the emergency room with  shortness of breath of a few days duration.  He was evaluated and I  admitted with exacerbation of bronchial asthma.  He was put on nebulizer  treatment and prednisone IV, and on antibiotic as well.  The patient  improved without any complication.  Today he is stable.  No shortness of  breath.  Vital Signs:  Temperature is 97, pulse is 64, respiratory rate  is 20 per minute, blood pressure is 120/77, and he is saturating 98% on  room air.  HEENT has pink conjunctivae, nonicteric sclera.  Neck is  supple.  Chest:  Good air entry bilaterally with scattered rhonchi.  Otherwise, there are no wheezes.  CVS:  S1, S2 well heard.  No murmur.  Abdomen is soft.  Extremities has no pedal edema.  CNS:  Alert and  oriented.   LABORATORIES TODAY:  White blood cell is 9 and hemoglobin is 11.8,  hematocrit is 34 and platelet count is 195.  On the chemistry, sodium is  136, potassium is 4, chloride 104, bicarb is 27, glucose is 134, BUN is  11 and creatinine is 1.  Liver function test is within normal range.  Chest x-ray is showing no acute cardiopulmonary abnormality.   The patient will be discharged today to have followup with his  PMD in  the coming 1 week.      Marcello Moores, MD  Electronically Signed     MT/MEDQ  D:  10/25/2006  T:  10/25/2006  Job:  [id number]

## 2010-08-09 NOTE — Progress Notes (Signed)
No PCP on record 

## 2010-08-11 NOTE — Discharge Summary (Signed)
NAME:  ANIS, DEGIDIO                  ACCOUNT NO.:  [id number]   MEDICAL RECORD NO.:  [id number]          PATIENT TYPE:  INP   LOCATION:  A225                          FACILITY:  [facility]   PHYSICIAN:  Patrica Duel, M.D.    DATE OF BIRTH:  20-Dec-1955   DATE OF ADMISSION:  07/22/2006  DATE OF DISCHARGE:  04/30/2008LH                               DISCHARGE SUMMARY   DISCHARGE DIAGNOSIS:  Status asthmaticus, good response to therapy.   For details regarding admission, please refer to the note.  Briefly,  this 55 year old male with negative history except for recurring bouts  of asthma had been lost to follow-up due to financial difficulties.  He  had been maintained on albuterol HFA.   The patient presented to the emergency department with four day history  of increasingly severe cough and shortness of breath.  He was treated  aggressively, did not respond, and was admitted for ongoing management.   COURSE IN THE HOSPITAL:  The patient was treated with high dose  steroids, routine measures of pulmonary toilet.  He responded very  promptly. His lungs were clear on the second hospital day, and he was  stable for discharge.  He was requesting discharge and seems stable.   MEDICATIONS:  1. Pred Dosepak 5 mg for 12 days.  2. ProAir HFA.  3. Over-the-counter cough medicines.   He will be followed in two weeks by me as an outpatient.      Patrica Duel, M.D.  Electronically Signed     MC/MEDQ  D:  08/19/2006  T:  08/19/2006  Job:  [id number]

## 2010-08-11 NOTE — H&P (Signed)
[name], [name]                  ACCOUNT NO.:  [id number]   MEDICAL RECORD NO.:  [id number]          PATIENT TYPE:  INP   LOCATION:  A338                          FACILITY:  [facility]   PHYSICIAN:  Patrica Duel, M.D.    DATE OF BIRTH:  1956-02-18   DATE OF ADMISSION:  05/02/2006  DATE OF DISCHARGE:  LH                              HISTORY & PHYSICAL   CHIEF COMPLAINT:  Cough, shortness breath.   HISTORY OF PRESENT ILLNESS:  [name] with a  longstanding history of asthma requiring hospitalization approximately  two years ago.  He has also had a cyst on his neck removed, otherwise  history is negative.   The patient presented to the emergency department with a four day  history of increasingly severe cough and wheezing which has been  refractory to his Ventolin HFA.  He has had some sputum production which  is purulent, but no rigor.  He denies nausea, vomiting, diarrhea,  melena, hematemesis, hematochezia, genitourinary symptoms.   In the emergency department a workup was undertaken.  Chest x-ray  revealed no infiltrates.  Hemogram was essentially normal with a white  count 4200, a hemoglobin of 14.3.  Chemistries normal except for mild  hypokalemia with a potassium of 3.4 and a glucose of 115.  Arterial  blood gas on room air pH 7.41, pCO2 of 39, pO2 of 56.4.  Chest x-ray  reportedly unremarkable.   The patient has continued to wheeze, despite routine emergency  department measures and is admitted with status asthmaticus.   CURRENT MEDICATIONS:  Include handheld nebulizer only.   ALLERGIES:  Possibly to SINGULAIR.   FAMILY HISTORY:  Family history is significant for coronary disease in  his father, no colorectal cancer.   REVIEW OF SYSTEMS:  Negative except as mentioned.   SOCIAL HISTORY:  Nonsmoker, nondrinker.   PHYSICAL EXAMINATION:  GENERAL APPEARANCE:  Very pleasant male who is  alert and oriented and in no acute distress.  VITAL SIGNS:  Presenting  Vital Signs: Temperature 98 degrees, BP  140/109, pulse 109, respirations 26 and labored, O2 sat 93%.  Currently  he is comfortable but wheezing.  HEENT:  Normocephalic, atraumatic.  Pupils are equal.  Ears, nose and  throat are benign.  NECK:  Is supple.  No JVD, thyromegaly, lymphadenopathy or bruits.  LUNGS:  Lungs reveal diffuse coarse expiratory wheezes and scattered  rhonchi.  HEART:  Heart sounds are normal without murmurs, rubs or gallops.  ABDOMEN:  Is nontender, nondistended.  Bowel sounds are intact.  EXTREMITIES:  No clubbing, cyanosis or edema.  NEUROLOGICAL:  Nonfocal.   ASSESSMENT:  Status asthmaticus.   PLAN:  Admit for IV steroids, intensive pulmonary toilet and broad  spectrum antibiotics.  Sputum for Gram stain etc. Will follow and treat  expectantly.      Patrica Duel, M.D.  Electronically Signed     MC/MEDQ  D:  05/02/2006  T:  05/02/2006  Job:  [id number]

## 2010-08-11 NOTE — H&P (Signed)
[name], [name]                  ACCOUNT NO.:  [id number]   MEDICAL RECORD NO.:  [id number]          PATIENT TYPE:  EMS   LOCATION:  ED                            FACILITY:  [facility]   PHYSICIAN:  Madelin Rear. [name], MD  DATE OF BIRTH:  [birth year]   DATE OF ADMISSION:  09/20/2004  DATE OF DISCHARGE:  LH                                HISTORY & PHYSICAL   CHIEF COMPLAINT:  Shortness of breath and cough.   HISTORY OF PRESENT ILLNESS:  The patient has had about a month of  progressively increasing productive cough. He has mixed severe dyspnea  unresponsive to real significant improvement on an albuterol metered dose  inhaler. He has a past medical history of asthma but otherwise  noncontributory.   SOCIAL HISTORY:  Positive smoking, positive alcohol, positive marijuana  usage.   FAMILY HISTORY:  Noncontributory.   REVIEW OF SYSTEMS:  As under HPI. All else negative.   PHYSICAL EXAMINATION:  GENERAL:  Awake, alert, cooperative.  HEENT:  Head and neck:  No JVD or thyromegaly. Neck supple.  CHEST:  Diffuse expiratory and inspiratory wheezing with scattered rhonchi  all fields. He has a prolonged expiratory phase and some tachypnea at 20 to  25 per minute.  ABDOMEN:  Soft. No organomegaly or masses.  EXTREMITIES:  Without clubbing, cyanosis, or edema.  CARDIAC:  Regular rhythm. No gallop or rub.   STUDIES:  Chest x-ray reported to be verbally as negative acute.   ASSESSMENT/PLAN:  The patient responded partially to nebulizer in the  emergency department but remained moderately hypoxic in spite of treatment.  He also had severe wheezing. He will be admitted for parenteral  bronchodilator and more intense regimen as well as antibiotic therapy.  Expect an observation.       LJF/MEDQ  D:  09/20/2004  T:  09/20/2004  Job:  [id number]

## 2010-08-11 NOTE — Discharge Summary (Signed)
NAME:  [name], [name]                  ACCOUNT NO.:  [id number]   MEDICAL RECORD NO.:  [id number]          PATIENT TYPE:  INP   LOCATION:  A225                          FACILITY:  [facility]   PHYSICIAN:  Madelin Rear. [name], MD  DATE OF BIRTH:  [birth year]   DATE OF ADMISSION:  09/20/2004  DATE OF DISCHARGE:  07/01/2006LH                                 DISCHARGE SUMMARY   DISCHARGE DIAGNOSES:  1.  Acute exacerbation of chronic obstructive pulmonary disease.  2.  Bronchitis precipitating #1.   DISCHARGE MEDICATIONS:  1.  Zithromax 500 once daily x5 days.  2.  Levaquin 750 mg p.o. once daily x5 days.  3.  Medrol Dose-Pak.  4.  Theo-Dur 200 mg p.o. b.i.d.   SUMMARY:  Patient was admitted with cough, shortness of breath, wheezing and  bronchospasm.  He responded nicely to parenteral steroids as well as  bronchodilators and round-the-clock nebs.  Antibiotic therapy was also  instituted.  His chest x-ray showed no acute pneumonias or other process.  On the day of discharge he was clear and to follow up in our office 1 week.       LJF/MEDQ  D:  09/23/2004  T:  09/23/2004  Job:  [id number]

## 2010-08-11 NOTE — Discharge Summary (Signed)
NAMEMIRKO, TAILOR                  ACCOUNT NO.:  [id number]   MEDICAL RECORD NO.:  [id number]          PATIENT TYPE:  INP   LOCATION:  A338                          FACILITY:  [facility]   PHYSICIAN:  Patrica Duel, M.D.    DATE OF BIRTH:  11/16/1955   DATE OF ADMISSION:  05/02/2006  DATE OF DISCHARGE:  02/09/2008LH                               DISCHARGE SUMMARY   DISCHARGE DIAGNOSES:  1. Severe asthmatic bronchitis with excellent response to therapy.  2. History of cyst removal of the neck; otherwise, history benign.   For details regarding admission please refer to the admitting note.  Briefly, this very pleasant 55 year old male who was admitted  approximately a year ago with refractory asthma presented to the  emergency department with a four day history of increasingly severe  cough and wheezing which had been refractory to his hand held Ventolin  HFA. He had had some sputum production which was purulent but no rigor.  He denied nausea and vomiting, diarrhea, melena, hematemesis,  hematochezia or genitourinary symptoms.   In the emergency department a chest x-ray revealed no infiltrates. His  hemogram was essentially normal with white count 4200, hemoglobin 14.3.  Potassium was 3.4, glucose 115; otherwise, chemistries unremarkable.  Blood gas revealed a pH 7.41, pCO2 39, pO2 56.   The patient continued to wheeze and struggled despite emergency  department measures. He was admitted with status asthmaticus.   COURSE IN THE HOSPITAL:  The patient did very well in the hospital after  IV steroid broad-spectrum antibiotic coverage and pulmonary toilet, etc.  He was converted to p.o.'s yesterday, and currently is feeling quite  well and requesting discharge.   The patient appears stable for discharge and will be followed closely as  an outpatient. Of note, gram stain revealed rare gram-negative rods,  rare gram-positive rods, and rare gram-positive cocci in pairs. The  sensitivities  are currently pending.   ALLERGIES:  He is allergic to SINGULAIR.   MEDICATIONS:  1. Levaquin 500 mg daily.  2. Xopenex HFA 2 puffs q.4 p.r.n.  3. Theo-Dur 200 mg daily.  4. Prednisone Dosepak 5 mg 12-day.   He is allergic to Singulair.  Will possibly require inhaled steroids,  and I will give him a prescription for Advair 250/50 1 puff b.i.d.   He will be followed through expectantly as an outpatient.      Patrica Duel, M.D.  Electronically Signed     MC/MEDQ  D:  05/04/2006  T:  05/04/2006  Job:  [id number]

## 2010-08-11 NOTE — H&P (Signed)
NAME:  [name], [name]                  ACCOUNT NO.:  [id number]   MEDICAL RECORD NO.:  [id number]          PATIENT TYPE:  INP   LOCATION:  A225                          FACILITY:  [facility]   PHYSICIAN:  Patrica Duel, M.D.    DATE OF BIRTH:  Feb 17, 1956   DATE OF ADMISSION:  07/23/2006  DATE OF DISCHARGE:  LH                              HISTORY & PHYSICAL   CHIEF COMPLAINT:  Shortness of breath.   HISTORY OF PRESENT ILLNESS:  [name] with a negative  past history except for recurring bouts of severe asthma.  He was last  admitted in February of this year with the same process.   The patient had been lost to follow-up due to financial difficulties.  Has been maintained on albuterol HFA only.   The patient developed increasingly severe cough associated with  persistent/refractory wheezing approximately 4 days prior to presenting  to the emergency department.  In the emergency department, he was  treated aggressively with routine measures including a continuous neb  and continued to wheeze despite this. He is administered IV steroids,  etc.   Due to the refractory nature of the patient's asthma, he is admitted for  ongoing aggressive therapy and further evaluation is indicated.   There is no history of headache, neurologic deficits, chest pain,  shortness of breath, palpitations, syncope, abdominal pain, nausea,  vomiting, diarrhea, melena, hematemesis, hematochezia or genitourinary  symptoms.   CURRENT MEDICATIONS:  Albuterol HFA.   ALLERGIES:  None known (previously stated allergy to Singulair is not  accurate).   SOCIAL HISTORY:  Nonsmoker, nondrinker. He has been unable to work due  to his respiratory status. 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  He is a nonsmoker,  nondrinker.  He is married to very nice woman who works at [employer].   REVIEW OF SYSTEMS:  Negative except as mentioned.   FAMILY HISTORY:   Noncontributory.   PHYSICAL EXAMINATION:  GENERAL:  A very pleasant, fully alert male.  He  states he is feeling much better after overnight treatment.  VITAL SIGNS:  Currently stable, blood pressure is normal. Heart rate is  approximately 80 and regular.  HEENT: Normocephalic, atraumatic.  Pupils are equal.  Ears, nose and  throat benign.  NECK:  Supple without lymphadenopathy, bruits, masses, or thyromegaly.  LUNGS:  Reveal diffuse fine expiratory wheezes throughout, no focal  adventitious sounds.  HEART:  Sounds are normal without murmurs, rubs or gallops.  ABDOMEN:  Nontender, nondistended.  Bowel sounds intact.  EXTREMITIES:  No clubbing, cyanosis or edema.  NEUROLOGIC:  Fully intact.   Additional data includes a chest x-ray which reveals hyperinflation  without acute abnormalities or infiltrates.   ASSESSMENT:  Status asthmaticus.   PLAN:  Admit for aggressive pulmonary toilet. Will have Social Services  see the patient to see if they can help him obtain financial assistance,  possibly a disability status due to his severe asthma.      Patrica Duel, M.D.  Electronically Signed  MC/MEDQ  D:  07/23/2006  T:  07/23/2006  Job:  [id number]

## 2010-08-25 ENCOUNTER — Other Ambulatory Visit: Payer: Self-pay | Admitting: Internal Medicine

## 2010-08-25 ENCOUNTER — Encounter (HOSPITAL_COMMUNITY): Payer: Medicare Other

## 2010-08-25 DIAGNOSIS — K222 Esophageal obstruction: Secondary | ICD-10-CM | POA: Insufficient documentation

## 2010-08-25 DIAGNOSIS — Z538 Procedure and treatment not carried out for other reasons: Secondary | ICD-10-CM | POA: Insufficient documentation

## 2010-08-25 DIAGNOSIS — Z01812 Encounter for preprocedural laboratory examination: Secondary | ICD-10-CM | POA: Insufficient documentation

## 2010-08-25 LAB — RAPID URINE DRUG SCREEN, HOSP PERFORMED
Amphetamines: NOT DETECTED
Benzodiazepines: NOT DETECTED
Cocaine: POSITIVE — AB
Opiates: NOT DETECTED
Tetrahydrocannabinol: POSITIVE — AB

## 2010-08-25 LAB — BASIC METABOLIC PANEL
BUN: 13 mg/dL (ref 6–23)
CO2: 25 mEq/L (ref 19–32)
Calcium: 10.1 mg/dL (ref 8.4–10.5)
Chloride: 102 mEq/L (ref 96–112)
Creatinine, Ser: 0.95 mg/dL (ref 0.4–1.5)
GFR calc non Af Amer: >60 mL/min (ref >60)
Glucose, Bld: 183 mg/dL — ABNORMAL HIGH (ref 70–99)
Potassium: 4.1 mEq/L (ref 3.5–5.1)
Sodium: 136 mEq/L (ref 135–145)

## 2010-08-25 LAB — HEMOGLOBIN AND HEMATOCRIT, BLOOD
HCT: 36.3 % — ABNORMAL LOW (ref 39.0–52.0)
Hemoglobin: 12.5 g/dL — ABNORMAL LOW (ref 13.0–17.0)

## 2010-08-30 ENCOUNTER — Telehealth: Payer: Self-pay | Admitting: Internal Medicine

## 2010-08-30 NOTE — Telephone Encounter (Signed)
Pt was scheduled for EGD/ED/TCS on 08/31/10, he called this morning stating he did not want to have the TCS done at this time and that he had eaten a large meal last evening. The TCS has been taken off the schedule. Endo is aware

## 2010-08-31 ENCOUNTER — Encounter: Payer: Self-pay | Admitting: Internal Medicine

## 2010-08-31 ENCOUNTER — Encounter: Payer: Medicare Other | Admitting: Internal Medicine

## 2010-08-31 ENCOUNTER — Ambulatory Visit (HOSPITAL_COMMUNITY)
Admission: RE | Admit: 2010-08-31 | Discharge: 2010-08-31 | Disposition: A | Discharge status: Home / Self Care | Payer: Medicare Other | Source: Ambulatory Visit | Attending: Internal Medicine | Admitting: Internal Medicine

## 2010-08-31 DIAGNOSIS — Z538 Procedure and treatment not carried out for other reasons: Secondary | ICD-10-CM | POA: Insufficient documentation

## 2010-08-31 DIAGNOSIS — K222 Esophageal obstruction: Secondary | ICD-10-CM | POA: Insufficient documentation

## 2010-09-19 ENCOUNTER — Encounter: Payer: Self-pay | Admitting: Gastroenterology

## 2010-09-19 ENCOUNTER — Ambulatory Visit (INDEPENDENT_AMBULATORY_CARE_PROVIDER_SITE_OTHER): Payer: Medicare Other | Admitting: Gastroenterology

## 2010-09-19 DIAGNOSIS — Z1211 Encounter for screening for malignant neoplasm of colon: Secondary | ICD-10-CM

## 2010-09-19 DIAGNOSIS — R1310 Dysphagia, unspecified: Secondary | ICD-10-CM

## 2010-09-19 NOTE — Patient Instructions (Signed)
Avoid tough textures. Continue to chew well, eat slowly.   We will discuss your case with the primary doctor. We will let you know about any possible procedures upcoming; it is likely though that you may need to follow-up with another practice for further procedures. We will let you know in the next few days.  Follow-up with a primary care doctor regarding your blood sugars.

## 2010-09-19 NOTE — Progress Notes (Signed)
Referring Provider: Gerrit Halls, NP Primary Care Physician:  Provider Not In System Primary Gastroenterologist: Dr. Jena Gauss   Chief Complaint  Patient presents with  . EGD    HPI:   Pt returns in f/u here. Hx of meat impaction April 27th requiring emergent endoscopy. Findings consistent with Schatzki's ring. Was supposed to be set up for follow-up endoscopy for likely dilation. Has hx of drug use. I had seen him myself the visit prior to proceeding with this elective procedure. I had given him strict instructions that he needed to abstain from all drug use. He was informed that his procedure would be cancelled if he did not comply. He had stated understanding. However, during pre-op labs, he was positive for cocaine and marijuana.  Due to this finding and non-compliance,  he was actually sent a d/c letter from our practice on August 31, 2010. He did receive this. He confirms verbally he has received this as well. When asked about drug use, states smoked marijuana last week, no cocaine for 3 weeks. Intermittent alcohol use, specifically on weekend. No dysphagia. Taking small bites, chewing well, avoiding tough textures. No GERD. No prior colorectal screening. Was set up for colonoscopy at time of EGD, yet now he does not want to proceed with this either. He actually cancelled the colonoscopy procedure prior to pre-op testing.  No abdominal pain, rectal bleeding.   Past Medical History  Diagnosis Date  . Asthma   . S/P endoscopy July 21, 2010    Dr. Jena Gauss: limited endoscopy, secondary to food impaction, Schatzki's ring seen    Past Surgical History  Procedure Date  . Lipoma removal from posterior neck     Current Outpatient Prescriptions  Medication Sig Dispense Refill  . pantoprazole (PROTONIX) 40 MG tablet       . PROAIR HFA 108 (90 BASE) MCG/ACT inhaler       . SPIRIVA HANDIHALER 18 MCG inhalation capsule         Allergies as of 09/19/2010  . (No Known Allergies)    Family  History  Problem Relation Age of Onset  . Bone cancer Mother     deceased in late 15s  . Aneurysm Father     deceased late 62s, brain    History   Social History  . Marital Status: Married    Spouse Name: N/A    Number of Children: N/A  . Years of Education: N/A   Social History Main Topics  . Smoking status: Former Smoker -- 1.0 packs/day    Types: Cigarettes  . Smokeless tobacco: Former Neurosurgeon    Quit date: 03/11/2010  . Alcohol Use: Yes     6 pack a week sometime a couple of shots  . Drug Use: 2 per week    Special: Marijuana     about three times a week  . Sexually Active: None     Review of Systems: Gen: Denies fever, chills, anorexia. Denies fatigue, weakness, weight loss.  CV: Denies chest pain, palpitations, syncope, peripheral edema, and claudication. Resp: Denies dyspnea at rest, cough, wheezing, coughing up blood, and pleurisy. GI: Denies vomiting blood, jaundice, and fecal incontinence.   Denies dysphagia or odynophagia. Derm: Denies rash, itching, dry skin Psych: Denies depression, anxiety, memory loss, confusion. No homicidal or suicidal ideation.  Heme: Denies bruising, bleeding, and enlarged lymph nodes.  Physical Exam: BP 134/90  Pulse 65  Temp(Src) 98 F (36.7 C) (Temporal)  Ht 5\' 10"  (1.778 m)  Wt 143 lb 6.4  oz (65.046 kg)  BMI 20.58 kg/m2 General:   Alert and oriented. No distress noted. Pleasant and cooperative. Thin, but well-nourished appearing.  Head:  Normocephalic and atraumatic. Eyes:  Conjuctiva clear without scleral icterus. Mouth:  Oral mucosa pink and moist. Good dentition. No lesions. Heart:  S1, S2 present without murmurs, rubs, or gallops. Regular rate and rhythm. Abdomen:  +BS, soft, non-tender and non-distended. No rebound or guarding. No HSM or masses noted. Msk:  Symmetrical without gross deformities. Normal posture. Extremities:  Without edema. Neurologic:  Alert and  oriented x4;  grossly normal neurologically. Skin:   Intact without significant lesions or rashes. Psych:  Alert and cooperative. Normal mood and affect.

## 2010-09-21 ENCOUNTER — Telehealth: Payer: Self-pay | Admitting: General Practice

## 2010-09-21 NOTE — Telephone Encounter (Signed)
I called pt to explain d/c letter,no answer,lmom.

## 2010-09-21 NOTE — Telephone Encounter (Signed)
Message copied by Jennings Books on Thu Sep 21, 2010  4:07 PM ------      Message from: Myra Rude      Created: Thu Sep 21, 2010  3:04 PM                   ----- Message -----         From: Gerrit Halls, NP         Sent: 09/21/2010   8:46 AM           To: Evalee Mutton, LPN            Hi      I saw this pt a few days ago. Has been d/c from practice. Please let him know that we will have to stick with that plan for now. We won't be able to proceed with any procedures due to non-compliance with drug abstinence.      Please just tell him he does need to seek PCP care. He is aware of this:)

## 2010-09-21 NOTE — Assessment & Plan Note (Signed)
No prior screening colonoscopy. Was set up for colonoscopy with EGD, yet cancelled colonoscopy prior to even being seen for pre-op labs/drug screen. Refusing colonoscopy at this time. As we have discharged pt from our practice, he understands the importance of screening and the need to follow-up with another gastroenterologist. I have also urged him to seek a primary care provider to establish care. Mildly elevated glucose on pre-op labs. Pt states understanding; he has verbally committed to seeking a PCP within the next week or so.  Again, we will cover him for emergent reasons until approximately September 30, 2010. He is aware of why he had to be d/c from our practice.

## 2010-09-21 NOTE — Assessment & Plan Note (Signed)
55 year old male with hx of dysphagia, now without any symptoms. Meat impaction in late April 2012 prompting emergent endoscopy, no dilation done at this time. Needed further evaluation with repeat EGD, yet failed drug test. +cocaine, marijuana, despite clear instructions to avoid drugs. He has been discharged from our practice as of August 31, 2010, but will be covered for 30 days for emergent reasons. He has no dysphagia, abdominal pain, or reflux at this time. I discussed with him that likely he will need to f/u with another provider, as he has been discharged from our practice. If anything emergent happens in the interim, we will be able to cover him until approximately September 30, 2010. He has stated understanding regarding this. He is fully aware of why the provider-patient relationship has been compromised.  He left in no distress and was quite pleasant.

## 2010-09-22 NOTE — Progress Notes (Signed)
Spoke to pt. He stated he has not used cocaine for 3 weeks. He will have UDS 7/3 and plan for EGD/dil on 7/5 if UDS neg for cocaine. Pt will call [facility] to confirm order has been faxed.  FAX ORDER TO LAB.

## 2010-09-25 ENCOUNTER — Encounter: Payer: Self-pay | Admitting: General Practice

## 2010-09-25 ENCOUNTER — Encounter: Payer: Self-pay | Admitting: Gastroenterology

## 2010-09-25 NOTE — Progress Notes (Signed)
Per Gerrit Halls, NP, Dr. Darrick Penna has decided to do pt. Per Tobi Bastos, info is in the office note (which has been closed)

## 2010-09-25 NOTE — Progress Notes (Signed)
Provider not in the system

## 2010-09-25 NOTE — Progress Notes (Signed)
Ave Filter 09/25/2010 2:42 PM Signed  Per Dr. Darrick Penna she will do procedure in endo with conscious sedation and she wanted the pt on the schedule for tomorrow. I instructed the pt to check into [facility] on 09/26/2010@6 :30am. Prep instructions for egd were also given by phone.  I am routing to Dr. Darrick Penna for signature.

## 2010-09-25 NOTE — Progress Notes (Signed)
Per Dr. Darrick Penna she will do procedure in endo with conscious sedation and she wanted the pt on the schedule for tomorrow. I instructed the pt to check into [facility] on 09/26/2010@6 :30am.  Prep instructions for egd were also given by phone.

## 2010-09-25 NOTE — Telephone Encounter (Signed)
error 

## 2010-09-25 NOTE — Progress Notes (Signed)
Pt informed about d/c instructions.

## 2010-09-25 NOTE — Progress Notes (Signed)
Please fax urine drug screen to pre-op testing at [facility]. Dr. Darrick Penna has spoken with pt. He will be calling our office regarding drug use today likely. He needs to be tentatively scheduled with Dr. Darrick Penna July 7th in OR secondary to alcohol/hx of drug use. Reason: dysphagia, Schatzki's ring.

## 2010-09-26 ENCOUNTER — Ambulatory Visit (HOSPITAL_COMMUNITY)
Admission: RE | Admit: 2010-09-26 | Discharge: 2010-09-26 | Disposition: A | Discharge status: Home / Self Care | Payer: Medicare Other | Source: Ambulatory Visit | Attending: Gastroenterology | Admitting: Gastroenterology

## 2010-09-26 ENCOUNTER — Other Ambulatory Visit: Payer: Self-pay | Admitting: Gastroenterology

## 2010-09-26 ENCOUNTER — Encounter: Payer: Medicare Other | Admitting: Gastroenterology

## 2010-09-26 DIAGNOSIS — K222 Esophageal obstruction: Secondary | ICD-10-CM

## 2010-09-26 DIAGNOSIS — K319 Disease of stomach and duodenum, unspecified: Secondary | ICD-10-CM | POA: Insufficient documentation

## 2010-09-26 DIAGNOSIS — K2970 Gastritis, unspecified, without bleeding: Secondary | ICD-10-CM | POA: Insufficient documentation

## 2010-09-26 DIAGNOSIS — R1310 Dysphagia, unspecified: Secondary | ICD-10-CM | POA: Insufficient documentation

## 2010-09-26 DIAGNOSIS — K2990 Gastroduodenitis, unspecified, without bleeding: Secondary | ICD-10-CM

## 2010-09-26 NOTE — Progress Notes (Signed)
AGREE

## 2010-10-18 NOTE — Op Note (Signed)
  NAME:  [name], [name] NO.:  [id number]  MEDICAL RECORD NO.:  [id number]  LOCATION:  DAYP                          FACILITY:  [facility]  PHYSICIAN:  Jonette Eva, M.D.     DATE OF BIRTH:  09/02/1955  DATE OF PROCEDURE:  09/26/2010 DATE OF DISCHARGE:                              OPERATIVE REPORT   REFERRING PROVIDER:  Pearletha Furl, NP-c, [facility]  PROCEDURE:  Esophagogastroduodenoscopy with cold forceps, biopsy of the gastric mucosa, and Savary dilation to 17 mm.  INDICATION FOR EXAMINATION:  [name] is a 55 year old male who presented with the food impaction in May 2012.  He was initially seen by Dr. Jena Gauss who discharged him from the practice on August 31, 2010.  He presents for esophageal dilation.  FINDINGS: 1. Distal esophageal ring area in the lumen approximately 13-14 mm.     The esophagus was dilated from 12.8-17 mm.  The 17-mm dilator     passed with mild resistance.  No evidence of Barrett mass, erosion,     or ulceration. 2. Patchy erythema with occasional erosions seen in the body and     antrum.  Biopsies obtained via cold forceps for H. pylori     gastritis. 3. Normal duodenal bulb and second portion of the duodenum.  DIAGNOSES: 1. Distal esophageal ring, status post dilation. 2. Moderate gastritis.  RECOMMENDATIONS: 1. Prilosec daily. 2. We will call him with results of his biopsies. 3. He should have a colonoscopy within the next year. 4. He is given information on gastritis. 5. No aspirin or NSAIDs or anticoagulation for 3 days. 6. He should follow a high-fiber diet.  He is a given a handout on     high-fiber diet.  MEDICATIONS: 1. Demerol 100 mg IV. 2. Versed 4 mg IV. 3. Phenergan 6.25 mg IV.  PROCEDURE TECHNIQUE:  Physical exam was performed.  Informed consent was obtained from the patient after explaining the benefits, risks, and alternatives to the procedure.  The patient was connected to the monitor and  placed in left lateral position.  Continuous oxygen was provided by nasal cannula and IV medicine was administered through an indwelling cannula.  After administration of sedation, the patient's esophagus was intubated and scope was advanced under direct visualization to the second portion of the duodenum.  The scope was removed slowly by careful examining the color, texture, anatomy, and integrity of mucosa on the way out.  Prior to removal of the scope, the retroflexed view of the cardia was performed and the guidewire was advanced.  The esophagus was dilated from 12.8-17 mm.  The guidewire and dilator were removed.  The patient was recovered in Endoscopy and discharged to home in satisfactory condition.  PATH: GASTRITIS   Jonette Eva, M.D.     SF/MEDQ  D:  09/26/2010  T:  09/26/2010  Job:  [id number]  cc:   Sydnee Levans, MD [address] [place], [place] [address]  Electronically Signed by Jonette Eva M.D. on 10/18/2010 12:56:58 PM

## 2011-01-02 LAB — BLOOD GAS, ARTERIAL
Acid-Base Excess: 4.6 — ABNORMAL HIGH
Acid-base deficit: 3.2 — ABNORMAL HIGH
Acid-base deficit: 4.1 — ABNORMAL HIGH
Bicarbonate: 19.4 — ABNORMAL LOW
Bicarbonate: 21.7
Bicarbonate: 23.9
FIO2: 0.21
FIO2: 0.6
FIO2: 1
Mode: POSITIVE
O2 Saturation: 90
O2 Saturation: 99.1
O2 Saturation: 99.7
PEEP: 4
Patient temperature: 37
Patient temperature: 37
Patient temperature: 98.7
Pressure support: 16
RATE: 12
TCO2: 17.4
TCO2: 21.8
pCO2 arterial: 34.1 — ABNORMAL LOW
pCO2 arterial: 40.4
pCO2 arterial: 57.7
pH, Arterial: 7.241 — ABNORMAL LOW
pH, Arterial: 7.349 — ABNORMAL LOW
pH, Arterial: 7.374
pO2, Arterial: 201 — ABNORMAL HIGH
pO2, Arterial: 404 — ABNORMAL HIGH
pO2, Arterial: 57 — ABNORMAL LOW

## 2011-01-02 LAB — BASIC METABOLIC PANEL
BUN: 9
CO2: 26
Calcium: 9.6
Chloride: 109
Creatinine, Ser: 1.06
GFR calc Af Amer: >60
GFR calc non Af Amer: >60
Glucose, Bld: 131 — ABNORMAL HIGH
Potassium: 4.6
Sodium: 141

## 2011-01-02 LAB — DIFFERENTIAL
Basophils Absolute: 0
Basophils Absolute: 0.1
Basophils Relative: 0
Basophils Relative: 1
Eosinophils Absolute: 0
Eosinophils Absolute: 1.4 — ABNORMAL HIGH
Eosinophils Relative: 0
Eosinophils Relative: 20 — ABNORMAL HIGH
Lymphocytes Relative: 40
Lymphocytes Relative: 6 — ABNORMAL LOW
Lymphs Abs: 0.7
Lymphs Abs: 2.8
Monocytes Absolute: 0.6
Monocytes Absolute: 0.8 — ABNORMAL HIGH
Monocytes Relative: 12 — ABNORMAL HIGH
Monocytes Relative: 5
Neutro Abs: 1.9
Neutro Abs: 10.2 — ABNORMAL HIGH
Neutrophils Relative %: 27 — ABNORMAL LOW
Neutrophils Relative %: 90 — ABNORMAL HIGH

## 2011-01-02 LAB — CBC
HCT: 37 — ABNORMAL LOW
HCT: 41.6
Hemoglobin: 12.7 — ABNORMAL LOW
Hemoglobin: 14.1
MCHC: 33.9
MCHC: 34.2
MCV: 89.1
MCV: 89.4
Platelets: 253
Platelets: 283
RBC: 4.14 — ABNORMAL LOW
RBC: 4.67
RDW: 13.4
RDW: 14
WBC: 11.4 — ABNORMAL HIGH
WBC: 7

## 2011-01-02 LAB — COMPREHENSIVE METABOLIC PANEL
ALT: 22
AST: 28
Albumin: 4.4
Alkaline Phosphatase: 56
BUN: 12
CO2: 25
Calcium: 9.1
Chloride: 106
Creatinine, Ser: 0.99
GFR calc non Af Amer: >60
Glucose, Bld: 131 — ABNORMAL HIGH
Potassium: 4.2
Sodium: 139
Total Bilirubin: 0.5
Total Protein: 6.9

## 2011-01-02 LAB — CULTURE, BLOOD (ROUTINE X 2)
Culture: NO GROWTH
Culture: NO GROWTH
Report Status: 11072008
Report Status: 11072008

## 2011-01-05 LAB — CBC
HCT: 37.1 — ABNORMAL LOW
Hemoglobin: 12.8 — ABNORMAL LOW
MCHC: 34.5
MCV: 88.4
Platelets: 181
RBC: 4.2 — ABNORMAL LOW
RDW: 13.6
WBC: 6.9

## 2011-01-05 LAB — DIFFERENTIAL
Basophils Absolute: 0
Basophils Relative: 0
Eosinophils Absolute: 0.3
Eosinophils Relative: 4
Lymphocytes Relative: 7 — ABNORMAL LOW
Lymphs Abs: 0.5 — ABNORMAL LOW
Monocytes Absolute: 0.1 — ABNORMAL LOW
Monocytes Relative: 2 — ABNORMAL LOW
Neutro Abs: 6
Neutrophils Relative %: 87 — ABNORMAL HIGH

## 2011-01-05 LAB — BASIC METABOLIC PANEL
BUN: 9
CO2: 24
Calcium: 8.7
Chloride: 111
Creatinine, Ser: 0.84
GFR calc Af Amer: >60
GFR calc non Af Amer: >60
Glucose, Bld: 124 — ABNORMAL HIGH
Potassium: 3.8
Sodium: 139

## 2011-01-08 LAB — BASIC METABOLIC PANEL
BUN: 13
CO2: 26
Calcium: 8.4
Chloride: 107
Creatinine, Ser: 1.08
GFR calc Af Amer: >60
GFR calc non Af Amer: >60
Glucose, Bld: 121 — ABNORMAL HIGH
Potassium: 3.7
Sodium: 137

## 2011-01-08 LAB — CBC
HCT: 34.2 — ABNORMAL LOW
HCT: 38.4 — ABNORMAL LOW
Hemoglobin: 11.8 — ABNORMAL LOW
Hemoglobin: 13.3
MCHC: 34.4
MCHC: 34.5
MCV: 89.3
MCV: 88.3
Platelets: 195
Platelets: 211
RBC: 3.83 — ABNORMAL LOW
RBC: 4.35
RDW: 14
RDW: 13.4
WBC: 9.1
WBC: 5.8

## 2011-01-08 LAB — DIFFERENTIAL
Basophils Absolute: 0
Basophils Absolute: 0
Basophils Relative: 0
Basophils Relative: 1
Eosinophils Absolute: 0
Eosinophils Absolute: 0.1
Eosinophils Relative: 0
Eosinophils Relative: 3
Lymphocytes Relative: 4 — ABNORMAL LOW
Lymphocytes Relative: 11 — ABNORMAL LOW
Lymphs Abs: 0.4 — ABNORMAL LOW
Lymphs Abs: 0.6 — ABNORMAL LOW
Monocytes Absolute: 0.3
Monocytes Absolute: 0.2
Monocytes Relative: 3
Monocytes Relative: 3
Neutro Abs: 8.4 — ABNORMAL HIGH
Neutro Abs: 4.8
Neutrophils Relative %: 93 — ABNORMAL HIGH
Neutrophils Relative %: 83 — ABNORMAL HIGH

## 2011-01-08 LAB — COMPREHENSIVE METABOLIC PANEL
ALT: 33
AST: 29
Albumin: 3.4 — ABNORMAL LOW
Alkaline Phosphatase: 38 — ABNORMAL LOW
BUN: 11
CO2: 27
Calcium: 8.8
Chloride: 104
Creatinine, Ser: 1.01
GFR calc Af Amer: >60
GFR calc non Af Amer: >60
Glucose, Bld: 134 — ABNORMAL HIGH
Potassium: 4.1
Sodium: 136
Total Bilirubin: 0.4
Total Protein: 5.5 — ABNORMAL LOW

## 2011-01-08 LAB — BLOOD GAS, ARTERIAL
Acid-base deficit: 1.2
Bicarbonate: 23
O2 Saturation: 95.4
Patient temperature: 37
TCO2: 20.4
pCO2 arterial: 38.3
pH, Arterial: 7.396
pO2, Arterial: 80.1

## 2011-01-08 LAB — CARDIAC PANEL(CRET KIN+CKTOT+MB+TROPI)
CK, MB: 1.5
Relative Index: INVALID
Total CK: 92
Troponin I: 0.02

## 2011-01-16 ENCOUNTER — Emergency Department (HOSPITAL_COMMUNITY): Payer: Medicare Other

## 2011-01-16 ENCOUNTER — Emergency Department (HOSPITAL_COMMUNITY)
Admission: EM | Admit: 2011-01-16 | Discharge: 2011-01-16 | Disposition: A | Discharge status: Home / Self Care | Payer: Medicare Other | Attending: Emergency Medicine | Admitting: Emergency Medicine

## 2011-01-16 ENCOUNTER — Encounter (HOSPITAL_COMMUNITY): Payer: Self-pay

## 2011-01-16 DIAGNOSIS — J40 Bronchitis, not specified as acute or chronic: Secondary | ICD-10-CM | POA: Insufficient documentation

## 2011-01-16 DIAGNOSIS — J45909 Unspecified asthma, uncomplicated: Secondary | ICD-10-CM | POA: Insufficient documentation

## 2011-01-16 DIAGNOSIS — Z87891 Personal history of nicotine dependence: Secondary | ICD-10-CM | POA: Insufficient documentation

## 2011-01-16 MED ORDER — IPRATROPIUM BROMIDE 0.02 % IN SOLN
0.5000 mg | Freq: Once | RESPIRATORY_TRACT | Status: DC
  Filled 2011-01-16: qty 2.5

## 2011-01-16 MED ORDER — ALBUTEROL SULFATE (5 MG/ML) 0.5% IN NEBU
INHALATION_SOLUTION | RESPIRATORY_TRACT | Status: AC
  Filled 2011-01-16: qty 0.5

## 2011-01-16 MED ORDER — METHYLPREDNISOLONE SODIUM SUCC 125 MG IJ SOLR: 125.0000 mg | Freq: Once | INTRAMUSCULAR | Status: DC

## 2011-01-16 MED ORDER — IPRATROPIUM BROMIDE 0.02 % IN SOLN
RESPIRATORY_TRACT | Status: AC
  Administered 2011-01-16: 0.5 mg
  Filled 2011-01-16: qty 2.5

## 2011-01-16 MED ORDER — ALBUTEROL (5 MG/ML) CONTINUOUS INHALATION SOLN
15.0000 mg/h | INHALATION_SOLUTION | Freq: Once | RESPIRATORY_TRACT | Status: AC
  Administered 2011-01-16: 15 mg/h via RESPIRATORY_TRACT
  Filled 2011-01-16: qty 20

## 2011-01-16 MED ORDER — IPRATROPIUM BROMIDE 0.02 % IN SOLN
0.5000 mg | Freq: Once | RESPIRATORY_TRACT | Status: AC
  Administered 2011-01-16: 0.5 mg via RESPIRATORY_TRACT

## 2011-01-16 MED ORDER — PREDNISONE 20 MG PO TABS: ORAL_TABLET | ORAL | Status: DC

## 2011-01-16 MED ORDER — SODIUM CHLORIDE 0.9 % IN NEBU
INHALATION_SOLUTION | RESPIRATORY_TRACT | Status: AC
  Administered 2011-01-16: 3 mL
  Filled 2011-01-16: qty 12

## 2011-01-16 MED ORDER — DOXYCYCLINE HYCLATE 100 MG PO CAPS: 100.0000 mg | ORAL_CAPSULE | Freq: Two times a day (BID) | ORAL | Status: AC

## 2011-01-16 MED ORDER — ALBUTEROL SULFATE (5 MG/ML) 0.5% IN NEBU
5.0000 mg | INHALATION_SOLUTION | Freq: Once | RESPIRATORY_TRACT | Status: AC
  Administered 2011-01-16: 5 mg via RESPIRATORY_TRACT

## 2011-01-16 MED ORDER — METHYLPREDNISOLONE SODIUM SUCC 125 MG IJ SOLR
125.0000 mg | Freq: Once | INTRAMUSCULAR | Status: AC
  Administered 2011-01-16: 125 mg via INTRAVENOUS
  Filled 2011-01-16: qty 2

## 2011-01-16 NOTE — ED Notes (Signed)
Pt left the er stating no needs no distress

## 2011-01-16 NOTE — ED Notes (Signed)
Pt stating resp relief with breathing no noted distress

## 2011-01-16 NOTE — ED Provider Notes (Signed)
History   This chart was scribed for Ward Givens, MD by Clarita Crane. The patient was seen in room [facility] and the patient's care was started at 7:34PM.   CSN: [id number] Arrival date & time: 01/16/2011  6:52 PM   First MD Initiated Contact with Patient 01/16/11 1901      Chief Complaint  Patient presents with  . Shortness of Breath   HPI [name] is a 55 y.o. male who presents to the Emergency Department complaining of constant moderate to severe productive cough with white sputum onset 4 days ago and persistent since with associated SOB and rhinorrhea with clear discharge. Denies fever, sore throat, nausea, vomiting, diarrhea. Patient reports mild improvement with administration of breathing treatment in ED. Patient with h/o asthma.  Past Medical History  Diagnosis Date  . Asthma   . S/P endoscopy July 21, 2010    Dr. Jena Gauss: limited endoscopy, secondary to food impaction, Schatzki's ring seen    Past Surgical History  Procedure Date  . Lipoma removal from posterior neck     Family History  Problem Relation Age of Onset  . Bone cancer Mother     deceased in late 58s  . Aneurysm Father     deceased late 59s, brain    History  Substance Use Topics  . Smoking status: Former Smoker -- 1.0 packs/day    Types: Cigarettes  . Smokeless tobacco: Former Neurosurgeon    Quit date: 03/11/2010  . Alcohol Use: Yes     6 pack a week sometime a couple of shots      Review of Systems 10 Systems reviewed and are negative for acute change except as noted in the HPI.  Allergies  Peanut-containing drug products  Home Medications   Current Outpatient Rx  Name Route Sig Dispense Refill  . ALBUTEROL SULFATE (2.5 MG/3ML) 0.083% IN NEBU Nebulization Take 2.5 mg by nebulization every 6 (six) hours as needed. For shortness of breath and/or wheezing     . PANTOPRAZOLE SODIUM 40 MG PO TBEC Oral Take 40 mg by mouth daily.     Marland Kitchen PROAIR HFA 108 (90 BASE) MCG/ACT IN AERS Inhalation Inhale  1 puff into the lungs every 4 (four) hours as needed. For shortness of breath    . SPIRIVA HANDIHALER 18 MCG IN CAPS        BP 170/91  Pulse 96  Temp(Src) 98.1 F (36.7 C) (Oral)  Resp 26  SpO2 98%  Physical Exam  Nursing note and vitals reviewed. Constitutional: He is oriented to person, place, and time. He appears well-developed and well-nourished. No distress.       Exam performed after administration of first nebulizer treatment in ED.  HENT:  Head: Normocephalic and atraumatic.  Eyes: EOM are normal. Pupils are equal, round, and reactive to light.  Neck: Neck supple. No tracheal deviation present.  Cardiovascular: Normal rate and regular rhythm.   No murmur heard. Pulmonary/Chest: Effort normal. He has wheezes.       Actively coughing during exam. Bilateral rhonchi and wheezing diffusely. Tachypnea. Retractions noted.   Abdominal: He exhibits no distension.  Musculoskeletal: Normal range of motion. He exhibits no edema.  Neurological: He is alert and oriented to person, place, and time. No sensory deficit.  Skin: Skin is warm and dry.  Psychiatric: He has a normal mood and affect. His behavior is normal.    ED Course  Procedures (including critical care time  The patient had received one albuterol Atrovent  nebulizer before his exam by me. He was given a continuous nebulizer 15 mg of albuterol and Atrovent 0.5 mg. When he was rechecked he was much more comfortable he was no longer having retractions his lungs were clear with good air movement. He states he feels ready to go home.   DIAGNOSTIC STUDIES: Oxygen Saturation is 93% on room air, adequate by my interpretation.    COORDINATION OF CARE:    Labs Reviewed - No data to display Dg Chest 2 View  01/16/2011  *RADIOLOGY REPORT*  Clinical Data: Productive cough.  Shortness of breath.  CHEST - 2 VIEW  Comparison: 05/20/2010 and 10/22/2006  Findings: Heart size and vascularity are normal.  There is peribronchial  thickening consistent with bronchitis.  No consolidative infiltrates or effusions.  No significant osseous abnormality.  IMPRESSION: Bronchitic changes.  Original Report Authenticated By: Gwynn Burly, M.D.     Diagnoses that have been ruled out:  Diagnoses that are still under consideration:  Final diagnoses:  Asthma  Bronchitis       MDM        I personally performed the services described in this documentation, which was scribed in my presence. The recorded information has been reviewed and considered. Devoria Albe, MD, [facility]    Ward Givens, MD 01/16/11 2207

## 2011-01-16 NOTE — ED Notes (Signed)
Pt stating upper respiratory congestion that moved into his chest, no fever, no n&v.

## 2011-01-16 NOTE — ED Notes (Signed)
Pt reports history of asthma.  Reports has been SOB for the past 3 or4 days.  Also reports productive cough with clear sputum.  Denies fever.  Has been using albuterol nebs at home without relief.

## 2011-02-20 ENCOUNTER — Encounter (HOSPITAL_COMMUNITY): Payer: Self-pay

## 2011-02-20 ENCOUNTER — Inpatient Hospital Stay (HOSPITAL_COMMUNITY)
Admission: EM | Admit: 2011-02-20 | Discharge: 2011-02-22 | DRG: 189 | Disposition: A | Discharge status: Home / Self Care | Payer: Medicare Other | Attending: Internal Medicine | Admitting: Internal Medicine

## 2011-02-20 ENCOUNTER — Emergency Department (HOSPITAL_COMMUNITY): Payer: Medicare Other

## 2011-02-20 DIAGNOSIS — J449 Chronic obstructive pulmonary disease, unspecified: Secondary | ICD-10-CM | POA: Diagnosis present

## 2011-02-20 DIAGNOSIS — J441 Chronic obstructive pulmonary disease with (acute) exacerbation: Secondary | ICD-10-CM | POA: Diagnosis present

## 2011-02-20 DIAGNOSIS — D649 Anemia, unspecified: Secondary | ICD-10-CM | POA: Diagnosis present

## 2011-02-20 DIAGNOSIS — F17200 Nicotine dependence, unspecified, uncomplicated: Secondary | ICD-10-CM | POA: Diagnosis present

## 2011-02-20 DIAGNOSIS — J9600 Acute respiratory failure, unspecified whether with hypoxia or hypercapnia: Principal | ICD-10-CM | POA: Diagnosis present

## 2011-02-20 HISTORY — DX: Gastro-esophageal reflux disease without esophagitis: K21.9

## 2011-02-20 HISTORY — DX: Bronchitis, not specified as acute or chronic: J40

## 2011-02-20 HISTORY — DX: Chronic obstructive pulmonary disease, unspecified: J44.9

## 2011-02-20 LAB — BLOOD GAS, ARTERIAL
Acid-base deficit: 0.4 mmol/L (ref 0.0–2.0)
Bicarbonate: 23.8 mEq/L (ref 20.0–24.0)
Delivery systems: POSITIVE
Expiratory PAP: 6
Inspiratory PAP: 12
O2 Content: 4 L/min
O2 Saturation: 96.5 %
Patient temperature: 37
RATE: 14 resp/min
TCO2: 21.2 mmol/L (ref 0–100)
pCO2 arterial: 39.4 mmHg (ref 35.0–45.0)
pH, Arterial: 7.398 (ref 7.350–7.450)
pO2, Arterial: 86.5 mmHg (ref 80.0–100.0)

## 2011-02-20 LAB — BASIC METABOLIC PANEL
BUN: 9 mg/dL (ref 6–23)
CO2: 28 mEq/L (ref 19–32)
Calcium: 9.3 mg/dL (ref 8.4–10.5)
Chloride: 106 mEq/L (ref 96–112)
Creatinine, Ser: 0.89 mg/dL (ref 0.50–1.35)
GFR calc Af Amer: >90 mL/min (ref >90)
GFR calc non Af Amer: >90 mL/min (ref >90)
Glucose, Bld: 125 mg/dL — ABNORMAL HIGH (ref 70–99)
Potassium: 3.6 mEq/L (ref 3.5–5.1)
Sodium: 142 mEq/L (ref 135–145)

## 2011-02-20 LAB — VITAMIN B12: Vitamin B-12: 493 pg/mL (ref 211–911)

## 2011-02-20 LAB — CBC
HCT: 37.8 % — ABNORMAL LOW (ref 39.0–52.0)
Hemoglobin: 12.8 g/dL — ABNORMAL LOW (ref 13.0–17.0)
MCH: 30 pg (ref 26.0–34.0)
MCHC: 33.9 g/dL (ref 30.0–36.0)
MCV: 88.7 fL (ref 78.0–100.0)
Platelets: 197 10*3/uL (ref 150–400)
RBC: 4.26 MIL/uL (ref 4.22–5.81)
RDW: 13.3 % (ref 11.5–15.5)
WBC: 7 10*3/uL (ref 4.0–10.5)

## 2011-02-20 LAB — INFLUENZA PANEL BY PCR (TYPE A & B)
H1N1 flu by pcr: NOT DETECTED
Influenza A By PCR: NEGATIVE
Influenza B By PCR: NEGATIVE

## 2011-02-20 LAB — MRSA PCR SCREENING: MRSA by PCR: NEGATIVE

## 2011-02-20 MED ORDER — MOXIFLOXACIN HCL IN NACL 400 MG/250ML IV SOLN
INTRAVENOUS | Status: AC
  Filled 2011-02-20: qty 250

## 2011-02-20 MED ORDER — ACETAMINOPHEN 650 MG RE SUPP: 650.0000 mg | Freq: Four times a day (QID) | RECTAL | Status: DC | PRN

## 2011-02-20 MED ORDER — METHYLPREDNISOLONE SODIUM SUCC 125 MG IJ SOLR
INTRAMUSCULAR | Status: AC
  Administered 2011-02-20: 125 mg via INTRAVENOUS
  Filled 2011-02-20: qty 2

## 2011-02-20 MED ORDER — SODIUM CHLORIDE 0.9 % IN NEBU
INHALATION_SOLUTION | RESPIRATORY_TRACT | Status: AC
  Administered 2011-02-20: 3 mL
  Filled 2011-02-20: qty 3

## 2011-02-20 MED ORDER — ALBUTEROL SULFATE (5 MG/ML) 0.5% IN NEBU
2.5000 mg | INHALATION_SOLUTION | RESPIRATORY_TRACT | Status: DC | PRN
  Administered 2011-02-20: 2.5 mg via RESPIRATORY_TRACT
  Filled 2011-02-20: qty 0.5

## 2011-02-20 MED ORDER — LORAZEPAM 2 MG/ML IJ SOLN: 0.5000 mg | INTRAMUSCULAR | Status: DC | PRN

## 2011-02-20 MED ORDER — METHYLPREDNISOLONE SODIUM SUCC 125 MG IJ SOLR
125.0000 mg | Freq: Once | INTRAMUSCULAR | Status: AC
  Administered 2011-02-20: 125 mg via INTRAVENOUS

## 2011-02-20 MED ORDER — MOXIFLOXACIN HCL IN NACL 400 MG/250ML IV SOLN
400.0000 mg | INTRAVENOUS | Status: DC
  Administered 2011-02-20 – 2011-02-21 (×2): 400 mg via INTRAVENOUS
  Filled 2011-02-20 (×3): qty 250

## 2011-02-20 MED ORDER — TIOTROPIUM BROMIDE MONOHYDRATE 18 MCG IN CAPS
18.0000 ug | ORAL_CAPSULE | Freq: Every day | RESPIRATORY_TRACT | Status: DC
  Administered 2011-02-21 – 2011-02-22 (×2): 18 ug via RESPIRATORY_TRACT
  Filled 2011-02-20: qty 5

## 2011-02-20 MED ORDER — ALBUTEROL SULFATE (5 MG/ML) 0.5% IN NEBU
2.5000 mg | INHALATION_SOLUTION | Freq: Four times a day (QID) | RESPIRATORY_TRACT | Status: DC
  Administered 2011-02-20 – 2011-02-21 (×4): 2.5 mg via RESPIRATORY_TRACT
  Filled 2011-02-20 (×5): qty 0.5

## 2011-02-20 MED ORDER — INFLUENZA VIRUS VACC SPLIT PF IM SUSP
0.5000 mL | INTRAMUSCULAR | Status: AC
  Administered 2011-02-21: 0.5 mL via INTRAMUSCULAR
  Filled 2011-02-20: qty 0.5

## 2011-02-20 MED ORDER — METHYLPREDNISOLONE SODIUM SUCC 125 MG IJ SOLR
80.0000 mg | Freq: Four times a day (QID) | INTRAMUSCULAR | Status: DC
  Administered 2011-02-20 – 2011-02-21 (×3): 80 mg via INTRAVENOUS
  Filled 2011-02-20 (×3): qty 2

## 2011-02-20 MED ORDER — MORPHINE SULFATE 2 MG/ML IJ SOLN: 2.0000 mg | INTRAMUSCULAR | Status: DC | PRN

## 2011-02-20 MED ORDER — ZOLPIDEM TARTRATE 5 MG PO TABS
5.0000 mg | ORAL_TABLET | Freq: Every evening | ORAL | Status: DC | PRN
  Administered 2011-02-20: 5 mg via ORAL
  Filled 2011-02-20: qty 1

## 2011-02-20 MED ORDER — ACETAMINOPHEN 325 MG PO TABS
ORAL_TABLET | ORAL | Status: AC
  Administered 2011-02-20: 650 mg
  Filled 2011-02-20: qty 2

## 2011-02-20 MED ORDER — SODIUM CHLORIDE 0.9 % IV SOLN
INTRAVENOUS | Status: DC
  Administered 2011-02-20: 20:00:00 via INTRAVENOUS
  Administered 2011-02-20: 500 mL via INTRAVENOUS

## 2011-02-20 MED ORDER — ALBUTEROL SULFATE (5 MG/ML) 0.5% IN NEBU
5.0000 mg | INHALATION_SOLUTION | Freq: Once | RESPIRATORY_TRACT | Status: AC
  Administered 2011-02-20: 5 mg via RESPIRATORY_TRACT
  Filled 2011-02-20: qty 1

## 2011-02-20 MED ORDER — HEPARIN SODIUM (PORCINE) 5000 UNIT/ML IJ SOLN
5000.0000 [IU] | Freq: Three times a day (TID) | INTRAMUSCULAR | Status: DC
  Administered 2011-02-20 – 2011-02-22 (×5): 5000 [IU] via SUBCUTANEOUS
  Filled 2011-02-20 (×5): qty 1

## 2011-02-20 MED ORDER — LORAZEPAM 2 MG/ML IJ SOLN
0.5000 mg | INTRAMUSCULAR | Status: DC | PRN
  Administered 2011-02-20 – 2011-02-21 (×3): 0.5 mg via INTRAVENOUS
  Filled 2011-02-20 (×4): qty 1

## 2011-02-20 MED ORDER — SODIUM CHLORIDE 0.9 % IV BOLUS (SEPSIS)
500.0000 mL | Freq: Once | INTRAVENOUS | Status: AC
  Administered 2011-02-20: 500 mL via INTRAVENOUS

## 2011-02-20 MED ORDER — ACETAMINOPHEN 325 MG PO TABS
650.0000 mg | ORAL_TABLET | Freq: Four times a day (QID) | ORAL | Status: DC | PRN
Start: 2011-02-20 — End: 2011-02-22

## 2011-02-20 NOTE — ED Provider Notes (Signed)
History  This chart was scribed for Melvin Kras, MD by Bennett Scrape. This patient was seen in room APA18/APA18 and the patient's care was started at 7:54AM.  CSN: [id number] Arrival date & time: 02/20/2011  7:51 AM   First MD Initiated Contact with Patient 02/20/11 [phone]      Chief Complaint  Patient presents with  . Shortness of Breath    HPI  [name] is a 55 y.o. male brought in by ambulance, who presents to the Emergency Department complaining of 2 days of gradual onset, gradually worsening constant SOB that began with sneezing and chest congestion with associated mild constant headache.  EMS found pt in respiratory distress. EMS administered a total of 10mg  albuterol, 0.5mg  atrovent, and 125mg  solumedrol IV to patient with improvement. Nurse reports that pt is coughing up light bloodily sputum upon arrival to ED. Pt denies leg swelling, vomiting and diarrhea as associated symptoms. Pt reports that his at home breathing treatments did not improve his symptoms. Pt reports that exertion aggravates his symptoms.  Pt has a h/o asthma and reports quitting smoking last year. Pt denies any other medical problems such as heart related issues or diabetes.   Pt reports he goes to [facility] in [place].  Past Medical History  Diagnosis Date  . Asthma   . S/P endoscopy July 21, 2010    Dr. Jena Gauss: limited endoscopy, secondary to food impaction, Schatzki's ring seen  . Bronchitis     Past Surgical History  Procedure Date  . Lipoma removal from posterior neck     Family History  Problem Relation Age of Onset  . Bone cancer Mother     deceased in late 42s  . Aneurysm Father     deceased late 2s, brain    History  Substance Use Topics  . Smoking status: Former Smoker -- 1.0 packs/day    Types: Cigarettes  . Smokeless tobacco: Former Neurosurgeon    Quit date: 03/11/2010  . Alcohol Use: Yes     6 pack a week sometime a couple of shots      Review of  Systems A complete 10 system review of systems was obtained and is otherwise negative except as noted in the HPI.   Allergies  Peanut-containing drug products  Home Medications   Current Outpatient Rx  Name Route Sig Dispense Refill  . ALBUTEROL SULFATE (2.5 MG/3ML) 0.083% IN NEBU Nebulization Take 2.5 mg by nebulization every 6 (six) hours as needed. For shortness of breath and/or wheezing     . PANTOPRAZOLE SODIUM 40 MG PO TBEC Oral Take 40 mg by mouth daily.     Marland Kitchen PREDNISONE 20 MG PO TABS  Take 3 po QD x 2d starting tomorrow, then 2 po QD x 3d then 1 po QD x 3d 15 tablet 0  . PROAIR HFA 108 (90 BASE) MCG/ACT IN AERS Inhalation Inhale 1 puff into the lungs every 4 (four) hours as needed. For shortness of breath    . SPIRIVA HANDIHALER 18 MCG IN CAPS        Ht 5\' 10"  (1.778 m)  Wt 145 lb (65.772 kg)  BMI 20.81 kg/m2  Physical Exam  Nursing note and vitals reviewed. Constitutional: He is oriented to person, place, and time. He appears well-developed and well-nourished.  HENT:  Head: Normocephalic and atraumatic.  Right Ear: External ear normal.  Left Ear: External ear normal.  Eyes: Conjunctivae are normal. Right eye exhibits no discharge. Left eye  exhibits no discharge. No scleral icterus.  Neck: Neck supple. No tracheal deviation present.  Cardiovascular: Normal rate, regular rhythm and intact distal pulses.   Pulmonary/Chest: Effort normal. No stridor. He has wheezes (expiratory wheezes in all lung fields). He has no rales.       Retractions noted, labored breathing, pt able to speak in few word sentences  Abdominal: Soft. Bowel sounds are normal. He exhibits no distension. There is no tenderness. There is no rebound and no guarding.  Musculoskeletal: He exhibits no edema and no tenderness.  Neurological: He is alert and oriented to person, place, and time. He has normal strength. No sensory deficit. Cranial nerve deficit:  no gross defecits noted. He exhibits normal muscle  tone. He displays no seizure activity. Coordination normal.  Skin: Skin is warm and dry. No rash noted.  Psychiatric: He has a normal mood and affect.    ED Course  Procedures (including critical care time)  Date: 02/20/2011  Rate: 107  Rhythm: sinus tachycardia  QRS Axis: normal  Intervals: normal  ST/T Wave abnormalities: normal  Conduction Disutrbances:none  Narrative Interpretation: Poor R-wave progression  Old EKG Reviewed: unchanged Coughing observed while he is in the emergency department. No bloody sputum noted.  DIAGNOSTIC STUDIES: Oxygen Saturation is 97% on [place], normal by my interpretation.    COORDINATION OF CARE: 7:58AM-Discussed treatment plan with patient at bedside and patient agreed to plan.   Labs Reviewed  BASIC METABOLIC PANEL - Abnormal; Notable for the following:    Glucose, Bld 125 (*)    All other components within normal limits  CBC - Abnormal; Notable for the following:    Hemoglobin 12.8 (*)    HCT 37.8 (*)    All other components within normal limits   Dg Chest [facility] 1 View  02/20/2011  *RADIOLOGY REPORT*  Clinical Data: Breath, asthma, COPD  PORTABLE CHEST - 1 VIEW  Comparison: 07/17/2010; 05/20/2010; 03/12/2010  Findings: Unchanged cardiac silhouette and mediastinal contours. Lungs remain hyperinflated.  No focal airspace opacities. No definite pleural effusion or pneumothorax.  No acute osseous abnormalities.  IMPRESSION: Hyperexpanded lungs without definite superimposed acute cardiopulmonary disease.  Original Report Authenticated By: Waynard Reeds, M.D.     No diagnosis found.    MDM  Patient with history of COPD who presents with recurrent COPD exacerbation. Patient given several treatments by EMS. Treatments continued here in the emergency room.  9:38 AM on repeat exam patient still with persistent wheezing although he states he is feeling somewhat better.    I personally performed the services described in this documentation,  which was scribed in my presence.  The recorded information has been reviewed and considered.     Melvin Kras, MD 02/20/11 [phone]

## 2011-02-20 NOTE — ED Notes (Signed)
Paged RT, she to be here in a few minutes

## 2011-02-20 NOTE — ED Notes (Signed)
Pt began to have labored breathing.  Notified hospitalist.  RT at bedside

## 2011-02-20 NOTE — ED Notes (Signed)
Pt reports started with cold symptoms Sunday and sob has been getting worse.  Has been using albuterol nebs at home without relief.  See triage note.

## 2011-02-20 NOTE — H&P (Signed)
Hospital Admission Note Date: 02/20/2011  PCP: Provider Not In System  Chief Complaint: Dyspnea  History of Present Illness: This 55 year old male with ongoing tobacco abuse past medical history of COPD status post PFT by Dr. Juanetta Gosling January 2012. He comes in for dyspnea. He relates his wife was sick one week ago, with flulike symptoms. 2 days prior to admission he started getting short of breath. he was using his albuterol every 4 hours. He relates that yesterday night he couldn't sleep as his shortness of breath progressively got worse. To the point where he was using his albuterol every hour. And continue to cough. Significantly cough production. Today he relates he still short of breath. He feels more tired.  The patient appears in distress using respiratory muscles to breathe. He satting 97% he denies any chest pain shortness of breath nausea vomiting or diarrhea.  Allergies: Other and Peanut-containing drug products Past Medical History  Diagnosis Date  .  COPD, with PFTs done by Dr. Juanetta Gosling and showed chronic obstructive disease disease pattern    . S/P endoscopy July 21, 2010    Dr. Jena Gauss: limited endoscopy, secondary to food impaction, Schatzki's ring seen  . Bronchitis    Prior to Admission medications   Medication Sig Start Date End Date Taking? Authorizing Provider  albuterol (PROVENTIL) (2.5 MG/3ML) 0.083% nebulizer solution Take 2.5 mg by nebulization every 6 (six) hours as needed. For shortness of breath and/or wheezing    Yes Historical Provider, MD  pantoprazole (PROTONIX) 40 MG tablet Take 40 mg by mouth daily.  07/21/10  Yes Historical Provider, MD   Past Surgical History  Procedure Date  . Lipoma removal from posterior neck    Family History  Problem Relation Age of Onset  . Bone cancer Mother     deceased in late 53s  . Aneurysm Father     deceased late 4s, brain   History   Social History  . Marital Status: Married    Spouse Name: N/A    Number of  Children: N/A  . Years of Education: N/A   Occupational History  . Not on file.   Social History Main Topics  . Smoking status: Current Everyday Smoker -- 0.5 packs/day    Types: Cigarettes  . Smokeless tobacco: Former Neurosurgeon    Quit date: 03/11/2010  . Alcohol Use: Yes     6 pack a week sometime a couple of shots  . Drug Use: 2 per week    Special: Marijuana     about three times a week  . Sexually Active: Not on file   Other Topics Concern  . Not on file   Social History Narrative  . No narrative on file   Review of Systems: Pertinent items are noted in HPI. Physical Exam: Filed Vitals:   02/20/11 0912 02/20/11 1015 02/20/11 1042 02/20/11 1128  BP:  102/80  124/80  Pulse:    84  Temp:      TempSrc:      Resp:    19  Height:      Weight:      SpO2: 98% 97% 98% 96%   No intake or output data in the 24 hours ending 02/20/11 1334 BP 124/80  Pulse 84  Temp(Src) 98.1 F (36.7 C) (Oral)  Resp 19  Ht 5\' 10"  (1.778 m)  Wt 65.772 kg (145 lb)  BMI 20.81 kg/m2  SpO2 96%  General Appearance:    Alert, cooperative, in distress using accessory muscles to  breathe.   Head:    Normocephalic, without obvious abnormality, atraumatic  Eyes:    PERRL, conjunctiva/corneas clear, EOM's intact, fundi    benign, both eyes       Ears:    Normal TM's and external ear canals, both ears  Nose:   Nares normal, septum midline, mucosa normal, no drainage    or sinus tenderness  Throat:   Lips, mucosa, and tongue normal; teeth and gums normal  Neck:   Supple, symmetrical, trachea midline, no adenopathy;       thyroid:  No enlargement/tenderness/nodules; no carotid   bruit or JVD  Back:     Symmetric, no curvature, ROM normal, no CVA tenderness  Lungs:     poor air movement with labored breathing and strong wheezing bilaterally.   Chest wall:    No tenderness or deformity  Heart:    Regular rate and rhythm, S1 and S2 normal, no murmur, rub   or gallop  Abdomen:     Soft, non-tender, bowel  sounds active all four quadrants,    no masses, no organomegaly  Genitalia:    Normal male without lesion, discharge or tenderness  Rectal:    Normal tone, normal prostate, no masses or tenderness;   guaiac negative stool  Extremities:   Extremities normal, atraumatic, no cyanosis or edema  Pulses:   2+ and symmetric all extremities  Skin:   Skin color, texture, turgor normal, no rashes or lesions  Lymph nodes:   Cervical, supraclavicular, and axillary nodes normal  Neurologic:   CNII-XII intact. Normal strength, sensation and reflexes      throughout   Lab results:  Basename 02/20/11 0802  NA 142  K 3.6  CL 106  CO2 28  GLUCOSE 125*  BUN 9  CREATININE 0.89  CALCIUM 9.3  MG --  PHOS --   No results found for this basename: AST:2,ALT:2,ALKPHOS:2,BILITOT:2,PROT:2,ALBUMIN:2 in the last 72 hours No results found for this basename: LIPASE:2,AMYLASE:2 in the last 72 hours  Basename 02/20/11 0802  WBC 7.0  NEUTROABS --  HGB 12.8*  HCT 37.8*  MCV 88.7  PLT 197   No results found for this basename: CKTOTAL:3,CKMB:3,CKMBINDEX:3,TROPONINI:3 in the last 72 hours No results found for this basename: POCBNP:3 in the last 72 hours No results found for this basename: DDIMER:2 in the last 72 hours No results found for this basename: HGBA1C:2 in the last 72 hours No results found for this basename: CHOL:2,HDL:2,LDLCALC:2,TRIG:2,CHOLHDL:2,LDLDIRECT:2 in the last 72 hours No results found for this basename: TSH,T4TOTAL,FREET3,T3FREE,THYROIDAB in the last 72 hours No results found for this basename: VITAMINB12:2,FOLATE:2,FERRITIN:2,TIBC:2,IRON:2,RETICCTPCT:2 in the last 72 hours Imaging results:  Dg Chest Port 1 View  02/20/2011  *RADIOLOGY REPORT*  Clinical Data: Breath, asthma, COPD  PORTABLE CHEST - 1 VIEW  Comparison: 07/17/2010; 05/20/2010; 03/12/2010  Findings: Unchanged cardiac silhouette and mediastinal contours. Lungs remain hyperinflated.  No focal airspace opacities. No definite  pleural effusion or pneumothorax.  No acute osseous abnormalities.  IMPRESSION: Hyperexpanded lungs without definite superimposed acute cardiopulmonary disease.  Original Report Authenticated By: Waynard Reeds, M.D.   Other results: EKG: normal EKG, normal sinus rhythm.   Patient Active Hospital Problem List:  Acute respiratory failure (02/20/2011)  -we'll admit to step down unit. We'll place him on BiPAP we'll get an ABG. We'll start him on inhalers. And monitor his saturations very closely. We'll start him on empiric treatment for community-acquired pneumonia. Although his chest x-ray does not look impressive, just some hyperexpansion of the  lung We will go ahead and check an influenza PCR and start him on Tamiflu empirically.  COPD (chronic obstructive pulmonary disease) (02/20/2011) -was started on IV steroids, Spiriva and Atrovent. We'll also start him on Tamiflu and check for influenza PCR. When admitted to step down unit we'll start him on BiPAP and get an ABG.   Anemia (02/20/2011) -currently hemoglobin borderline low we'll need a followup and workup as an outpatient he is 55 years old he has not had a colonoscopy. MCV within normal to the vitamin B12 and RBC folate  Tobacco abuse: Counseling .   Marinda Elk M.D. [facility] [phone] 02/20/2011, 1:34 PM

## 2011-02-20 NOTE — Plan of Care (Signed)
Problem: Consults Goal: Respiratory Problems Patient Education See Patient Education Module for education specifics. Outcome: Progressing Need for bipap tonight  Problem: ICU Phase Progression Outcomes Goal: Hemodynamically stable Outcome: Not Progressing Hypertension at present Goal: Flu/PneumoVaccines if indicated Outcome: Completed/Met Date Met:  02/20/11 Patient has received the pneumonia in the last 5 years.  And does want the flu vaccine this admission.  Order is in Goal: Initial discharge plan identified Outcome: Completed/Met Date Met:  02/20/11 Home with wife

## 2011-02-20 NOTE — ED Notes (Signed)
Pt was 93% on room air.  Pt ambulated around nurses desk but pt's o2 sat dropped to 88%.  Notified Dr. Lynelle Doctor.  Pt back in bed, placed pt on 02 at [employer].

## 2011-02-20 NOTE — ED Notes (Signed)
Called ICU, gave report

## 2011-02-20 NOTE — ED Notes (Signed)
Breathing treatment complete, switched pt to nasal cannula 4liters of 02.  Pt's 02 sat 96%.

## 2011-02-20 NOTE — ED Notes (Signed)
Pt resting, breathing easier, states feeling a little better.

## 2011-02-20 NOTE — ED Notes (Signed)
EMS reports pt has history of asthma and chronic bronchitis.  EMS reports arrived to find pt in respiratory distress.  Reports breathing difficulty started last night and pt took multiple breathing treatments.  Pt coughing hard and has some bright red blood in sputum.  EMS administered a total of 10mg  albuterol, 0.5mg  atrovent, and 125mg  solumedrol IV.  EMS reports pt is out of atrovent mdi but has albuterol neb.

## 2011-02-20 NOTE — ED Notes (Signed)
Gave report to Tammy RN

## 2011-02-20 NOTE — ED Notes (Signed)
Called to give report in ICU, RN to call me back.

## 2011-02-21 LAB — COMPREHENSIVE METABOLIC PANEL
ALT: 22 U/L (ref 0–53)
AST: 25 U/L (ref 0–37)
Albumin: 3.9 g/dL (ref 3.5–5.2)
Alkaline Phosphatase: 49 U/L (ref 39–117)
BUN: 9 mg/dL (ref 6–23)
CO2: 25 mEq/L (ref 19–32)
Calcium: 9.3 mg/dL (ref 8.4–10.5)
Chloride: 103 mEq/L (ref 96–112)
Creatinine, Ser: 0.87 mg/dL (ref 0.50–1.35)
GFR calc Af Amer: >90 mL/min (ref >90)
GFR calc non Af Amer: >90 mL/min (ref >90)
Glucose, Bld: 132 mg/dL — ABNORMAL HIGH (ref 70–99)
Potassium: 3.8 mEq/L (ref 3.5–5.1)
Sodium: 139 mEq/L (ref 135–145)
Total Bilirubin: 0.2 mg/dL — ABNORMAL LOW (ref 0.3–1.2)
Total Protein: 6.8 g/dL (ref 6.0–8.3)

## 2011-02-21 LAB — FOLATE RBC: RBC Folate: 533 ng/mL (ref >=366)

## 2011-02-21 MED ORDER — SODIUM CHLORIDE 0.9 % IN NEBU
INHALATION_SOLUTION | RESPIRATORY_TRACT | Status: AC
  Administered 2011-02-21: 3 mL
  Filled 2011-02-21: qty 3

## 2011-02-21 MED ORDER — SODIUM CHLORIDE 0.9 % IJ SOLN
INTRAMUSCULAR | Status: AC
  Filled 2011-02-21: qty 3

## 2011-02-21 MED ORDER — SODIUM CHLORIDE 0.9 % IN NEBU
INHALATION_SOLUTION | RESPIRATORY_TRACT | Status: AC
  Administered 2011-02-21: 20:00:00
  Filled 2011-02-21: qty 3

## 2011-02-21 MED ORDER — PREDNISONE 20 MG PO TABS
40.0000 mg | ORAL_TABLET | Freq: Every day | ORAL | Status: DC
  Administered 2011-02-21 – 2011-02-22 (×2): 40 mg via ORAL
  Filled 2011-02-21 (×2): qty 2

## 2011-02-21 MED ORDER — ALBUTEROL SULFATE (5 MG/ML) 0.5% IN NEBU
2.5000 mg | INHALATION_SOLUTION | RESPIRATORY_TRACT | Status: DC
  Administered 2011-02-21 – 2011-02-22 (×7): 2.5 mg via RESPIRATORY_TRACT
  Filled 2011-02-21 (×6): qty 0.5

## 2011-02-21 MED ORDER — FLUTICASONE-SALMETEROL 250-50 MCG/DOSE IN AEPB
1.0000 | INHALATION_SPRAY | Freq: Two times a day (BID) | RESPIRATORY_TRACT | Status: DC
  Administered 2011-02-21 – 2011-02-22 (×3): 1 via RESPIRATORY_TRACT
  Filled 2011-02-21: qty 14

## 2011-02-21 NOTE — Progress Notes (Signed)
Subjective: This man was admitted with exacerbation of COPD. He says he has not smoked any cigarettes for the last one year. He presented with a dry cough. His chest x-ray did not show pneumonia. His influenza test is negative. He feels that he is improving. He is on 4 L oxygen per minute. At home, he does not take any oxygen but he does admit to getting short of breath on exertion even when he is feeling well.           Physical Exam: Blood pressure 143/86, pulse 96, temperature 97.8 F (36.6 C), temperature source Axillary, resp. rate 22, height 5\' 10"  (1.778 m), weight 65.5 kg (144 lb 6.4 oz), SpO2 97.00%. He looks systemically well. He is in no respiratory distress. There is no increased work of breathing. Lung fields show bilateral wheezing throughout which is not critically tight. Heart sounds are present and normal without murmurs. There is no gallop rhythm. Jugular venous pressure not raised. He is alert and orientated.   Investigations:  Recent Results (from the past 240 hour(s))  MRSA PCR SCREENING     Status: Normal   Collection Time   02/20/11  4:59 PM      Component Value Range Status Comment   MRSA by PCR NEGATIVE  NEGATIVE  Final      Basic Metabolic Panel:  Basename 02/21/11 0425 02/20/11 0802  NA 139 142  K 3.8 3.6  CL 103 106  CO2 25 28  GLUCOSE 132* 125*  BUN 9 9  CREATININE 0.87 0.89  CALCIUM 9.3 9.3  MG -- --  PHOS -- --   Liver Function Tests:  Basename 02/21/11 0425  AST 25  ALT 22  ALKPHOS 49  BILITOT 0.2*  PROT 6.8  ALBUMIN 3.9     CBC:  Basename 02/20/11 0802  WBC 7.0  NEUTROABS --  HGB 12.8*  HCT 37.8*  MCV 88.7  PLT 197    Dg Chest Port 1 View  02/20/2011  *RADIOLOGY REPORT*  Clinical Data: Breath, asthma, COPD  PORTABLE CHEST - 1 VIEW  Comparison: 07/17/2010; 05/20/2010; 03/12/2010  Findings: Unchanged cardiac silhouette and mediastinal contours. Lungs remain hyperinflated.  No focal airspace opacities. No definite  pleural effusion or pneumothorax.  No acute osseous abnormalities.  IMPRESSION: Hyperexpanded lungs without definite superimposed acute cardiopulmonary disease.  Original Report Authenticated By: Waynard Reeds, M.D.      Medications: I have reviewed the patient's current medications.  Impression: 1. Exacerbation of COPD. 2. Borderline anemia.     Plan: 1. Discontinue IV steroids. Start oral steroids. 2. Start Advair inhaler. 3. Try to reduce FiO2. 4. Move to regular floor.     LOS: 1 day   [name] C 02/21/2011, 9:22 AM

## 2011-02-22 LAB — COMPREHENSIVE METABOLIC PANEL
ALT: 23 U/L (ref 0–53)
AST: 23 U/L (ref 0–37)
Albumin: 3.5 g/dL (ref 3.5–5.2)
Alkaline Phosphatase: 45 U/L (ref 39–117)
BUN: 14 mg/dL (ref 6–23)
CO2: 25 mEq/L (ref 19–32)
Calcium: 9.2 mg/dL (ref 8.4–10.5)
Chloride: 105 mEq/L (ref 96–112)
Creatinine, Ser: 1.01 mg/dL (ref 0.50–1.35)
GFR calc Af Amer: >90 mL/min (ref >90)
GFR calc non Af Amer: 81 mL/min — ABNORMAL LOW (ref >90)
Glucose, Bld: 96 mg/dL (ref 70–99)
Potassium: 3.5 mEq/L (ref 3.5–5.1)
Sodium: 139 mEq/L (ref 135–145)
Total Bilirubin: 0.2 mg/dL — ABNORMAL LOW (ref 0.3–1.2)
Total Protein: 6.2 g/dL (ref 6.0–8.3)

## 2011-02-22 LAB — CBC
HCT: 35.4 % — ABNORMAL LOW (ref 39.0–52.0)
Hemoglobin: 12 g/dL — ABNORMAL LOW (ref 13.0–17.0)
MCH: 30.8 pg (ref 26.0–34.0)
MCHC: 33.9 g/dL (ref 30.0–36.0)
MCV: 90.8 fL (ref 78.0–100.0)
Platelets: 213 10*3/uL (ref 150–400)
RBC: 3.9 MIL/uL — ABNORMAL LOW (ref 4.22–5.81)
RDW: 13.8 % (ref 11.5–15.5)
WBC: 10 10*3/uL (ref 4.0–10.5)

## 2011-02-22 MED ORDER — PREDNISONE 20 MG PO TABS: ORAL_TABLET | ORAL | Status: DC

## 2011-02-22 MED ORDER — ALBUTEROL SULFATE HFA 108 (90 BASE) MCG/ACT IN AERS: 2.0000 | INHALATION_SPRAY | Freq: Four times a day (QID) | RESPIRATORY_TRACT | Status: DC | PRN

## 2011-02-22 MED ORDER — FLUTICASONE-SALMETEROL 250-50 MCG/DOSE IN AEPB: 1.0000 | INHALATION_SPRAY | Freq: Two times a day (BID) | RESPIRATORY_TRACT | Status: DC

## 2011-02-22 MED ORDER — AZITHROMYCIN 500 MG PO TABS: 500.0000 mg | ORAL_TABLET | Freq: Every day | ORAL | Status: AC

## 2011-02-22 MED ORDER — INFLUENZA VIRUS VACC SPLIT PF IM SUSP
0.5000 mL | INTRAMUSCULAR | Status: DC
Start: 2011-02-22 — End: 2011-02-22
  Filled 2011-02-22: qty 0.5

## 2011-02-22 NOTE — Progress Notes (Signed)
UR Chart Review Completed  

## 2011-02-22 NOTE — Discharge Summary (Signed)
Physician Discharge Summary  Patient ID: [name] MRN: [id number] DOB/AGE: Jul 27, 1955 55 y.o.  Admit date: 02/20/2011 Discharge date: 02/22/2011    Discharge Diagnoses:  1. Exacerbation of COPD. 2. Mild anemia.   Current Discharge Medication List    START taking these medications   Details  albuterol (PROVENTIL HFA;VENTOLIN HFA) 108 (90 BASE) MCG/ACT inhaler Inhale 2 puffs into the lungs every 6 (six) hours as needed for wheezing. Qty: 1 Inhaler, Refills: 0    azithromycin (ZITHROMAX) 500 MG tablet Take 1 tablet (500 mg total) by mouth daily. Qty: 5 tablet, Refills: 0    Fluticasone-Salmeterol (ADVAIR) 250-50 MCG/DOSE AEPB Inhale 1 puff into the lungs 2 (two) times daily. Qty: 60 each, Refills: 0    predniSONE (DELTASONE) 20 MG tablet Take 2 tablets daily for 3 days, then 1 tablet daily for 3 days, then half tablet daily for 3 days, then STOP. Qty: 12 tablet, Refills: 0      CONTINUE these medications which have NOT CHANGED   Details  albuterol (PROVENTIL) (2.5 MG/3ML) 0.083% nebulizer solution Take 2.5 mg by nebulization every 6 (six) hours as needed. For shortness of breath and/or wheezing     pantoprazole (PROTONIX) 40 MG tablet Take 40 mg by mouth daily.     tiotropium (SPIRIVA) 18 MCG inhalation capsule Place 18 mcg into inhaler and inhale daily.          Discharged Condition: Stable and improved.    Consults: None.  Significant Diagnostic Studies: Dg Chest Port 1 View  02/20/2011  *RADIOLOGY REPORT*  Clinical Data: Breath, asthma, COPD  PORTABLE CHEST - 1 VIEW  Comparison: 07/17/2010; 05/20/2010; 03/12/2010  Findings: Unchanged cardiac silhouette and mediastinal contours. Lungs remain hyperinflated.  No focal airspace opacities. No definite pleural effusion or pneumothorax.  No acute osseous abnormalities.  IMPRESSION: Hyperexpanded lungs without definite superimposed acute cardiopulmonary disease.  Original Report Authenticated By: Waynard Reeds, M.D.     Lab Results: Basic Metabolic Panel:  Basename 02/22/11 0356 02/21/11 0425  NA 139 139  K 3.5 3.8  CL 105 103  CO2 25 25  GLUCOSE 96 132*  BUN 14 9  CREATININE 1.01 0.87  CALCIUM 9.2 9.3  MG -- --  PHOS -- --   Liver Function Tests:  [facility] 02/22/11 0356 02/21/11 0425  AST 23 25  ALT 23 22  ALKPHOS 45 49  BILITOT 0.2* 0.2*  PROT 6.2 6.8  ALBUMIN 3.5 3.9     CBC:  Basename 02/22/11 0356 02/20/11 0802  WBC 10.0 7.0  NEUTROABS -- --  HGB 12.0* 12.8*  HCT 35.4* 37.8*  MCV 90.8 88.7  PLT 213 197    Recent Results (from the past 240 hour(s))  MRSA PCR SCREENING     Status: Normal   Collection Time   02/20/11  4:59 PM      Component Value Range Status Comment   MRSA by PCR NEGATIVE  NEGATIVE  Final      Hospital Course: This 55 year old man was admitted with symptoms of dyspnea and a largely nonproductive cough. He was treated appropriately with intravenous steroids and antibiotics. He made a fairly quick recovery and is almost back to his baseline. Interestingly, he tells me that he does get short of breath on a daily basis almost . He has not smoked cigarettes for approximately one year. He feels well  enough today to go home.  Discharge Exam: Blood pressure 132/81, pulse 79, temperature 97.9 F (36.6 C), temperature source Oral,  resp. rate 20, height 5\' 10"  (1.778 m), weight 65.5 kg (144 lb 6.4 oz), SpO2 99.00%. He looks systemically well. There is no increased work of breathing. There is no peripheral or central cyanosis. Lung fields show wheezing bilaterally but it is not tight. He is saturating adequately on very minimal oxygen. I suspect he does not really need any oxygen whatsoever.  Disposition: Home. He will need reducing tapering course of prednisone as well as antibiotics. I've given him a prescription for Advair inhaler which he should take every day. However, costs of medications are a challenge and we will try to help him from the hospital but he  will have to face this as an outpatient. I've asked him to followup with his primary care physicians in a week.  Discharge Orders    Future Orders Please Complete By Expires   Diet - low sodium heart healthy      Increase activity slowly           Signed: Malachi Suderman C 02/22/2011, 8:18 AM

## 2011-07-13 ENCOUNTER — Emergency Department (HOSPITAL_COMMUNITY)
Admission: EM | Admit: 2011-07-13 | Discharge: 2011-07-13 | Disposition: A | Discharge status: Home / Self Care | Payer: Medicare Other | Attending: Emergency Medicine | Admitting: Emergency Medicine

## 2011-07-13 ENCOUNTER — Encounter (HOSPITAL_COMMUNITY): Payer: Self-pay

## 2011-07-13 ENCOUNTER — Emergency Department (HOSPITAL_COMMUNITY): Payer: Medicare Other

## 2011-07-13 DIAGNOSIS — K219 Gastro-esophageal reflux disease without esophagitis: Secondary | ICD-10-CM | POA: Insufficient documentation

## 2011-07-13 DIAGNOSIS — J4489 Other specified chronic obstructive pulmonary disease: Secondary | ICD-10-CM | POA: Insufficient documentation

## 2011-07-13 DIAGNOSIS — D72819 Decreased white blood cell count, unspecified: Secondary | ICD-10-CM

## 2011-07-13 DIAGNOSIS — J449 Chronic obstructive pulmonary disease, unspecified: Secondary | ICD-10-CM | POA: Insufficient documentation

## 2011-07-13 DIAGNOSIS — Z79899 Other long term (current) drug therapy: Secondary | ICD-10-CM | POA: Insufficient documentation

## 2011-07-13 DIAGNOSIS — R0602 Shortness of breath: Secondary | ICD-10-CM | POA: Insufficient documentation

## 2011-07-13 LAB — BASIC METABOLIC PANEL
BUN: 10 mg/dL (ref 6–23)
CO2: 25 mEq/L (ref 19–32)
Calcium: 9.7 mg/dL (ref 8.4–10.5)
Chloride: 104 mEq/L (ref 96–112)
Creatinine, Ser: 1.08 mg/dL (ref 0.50–1.35)
GFR calc Af Amer: 87 mL/min — ABNORMAL LOW (ref >90)
GFR calc non Af Amer: 75 mL/min — ABNORMAL LOW (ref >90)
Glucose, Bld: 98 mg/dL (ref 70–99)
Potassium: 4.1 mEq/L (ref 3.5–5.1)
Sodium: 138 mEq/L (ref 135–145)

## 2011-07-13 LAB — CBC
HCT: 40 % (ref 39.0–52.0)
Hemoglobin: 13.6 g/dL (ref 13.0–17.0)
MCH: 29.8 pg (ref 26.0–34.0)
MCHC: 34 g/dL (ref 30.0–36.0)
MCV: 87.7 fL (ref 78.0–100.0)
Platelets: 222 10*3/uL (ref 150–400)
RBC: 4.56 MIL/uL (ref 4.22–5.81)
RDW: 13.3 % (ref 11.5–15.5)
WBC: 4 10*3/uL (ref 4.0–10.5)

## 2011-07-13 LAB — DIFFERENTIAL
Basophils Absolute: 0.1 10*3/uL (ref 0.0–0.1)
Basophils Relative: 2 % — ABNORMAL HIGH (ref 0–1)
Eosinophils Absolute: 0.8 10*3/uL — ABNORMAL HIGH (ref 0.0–0.7)
Eosinophils Relative: 21 % — ABNORMAL HIGH (ref 0–5)
Lymphocytes Relative: 44 % (ref 12–46)
Lymphs Abs: 1.7 10*3/uL (ref 0.7–4.0)
Monocytes Absolute: 0.3 10*3/uL (ref 0.1–1.0)
Monocytes Relative: 7 % (ref 3–12)
Neutro Abs: 1 10*3/uL — ABNORMAL LOW (ref 1.7–7.7)
Neutrophils Relative %: 26 % — ABNORMAL LOW (ref 43–77)

## 2011-07-13 LAB — BLOOD GAS, ARTERIAL
Acid-base deficit: 0.7 mmol/L (ref 0.0–2.0)
Bicarbonate: 23.4 mEq/L (ref 20.0–24.0)
O2 Content: 2 L/min
O2 Saturation: 96 %
Patient temperature: 37
TCO2: 20.7 mmol/L (ref 0–100)
pCO2 arterial: 38.1 mmHg (ref 35.0–45.0)
pH, Arterial: 7.406 (ref 7.350–7.450)
pO2, Arterial: 80.5 mmHg (ref 80.0–100.0)

## 2011-07-13 MED ORDER — ALBUTEROL SULFATE (5 MG/ML) 0.5% IN NEBU
5.0000 mg | INHALATION_SOLUTION | Freq: Once | RESPIRATORY_TRACT | Status: AC
  Administered 2011-07-13: 5 mg via RESPIRATORY_TRACT
  Filled 2011-07-13: qty 1

## 2011-07-13 MED ORDER — METHYLPREDNISOLONE SODIUM SUCC 125 MG IJ SOLR
125.0000 mg | Freq: Once | INTRAMUSCULAR | Status: AC
  Administered 2011-07-13: 125 mg via INTRAVENOUS
  Filled 2011-07-13 (×2): qty 2

## 2011-07-13 MED ORDER — AZITHROMYCIN 250 MG PO TABS: 250.0000 mg | ORAL_TABLET | Freq: Every day | ORAL | Status: AC

## 2011-07-13 MED ORDER — PREDNISONE 50 MG PO TABS: ORAL_TABLET | ORAL | Status: AC

## 2011-07-13 MED ORDER — ALBUTEROL SULFATE HFA 108 (90 BASE) MCG/ACT IN AERS: 1.0000 | INHALATION_SPRAY | Freq: Four times a day (QID) | RESPIRATORY_TRACT | Status: DC | PRN

## 2011-07-13 MED ORDER — IPRATROPIUM BROMIDE 0.02 % IN SOLN: 0.5000 mg | Freq: Once | RESPIRATORY_TRACT | Status: DC

## 2011-07-13 MED ORDER — ALBUTEROL SULFATE (5 MG/ML) 0.5% IN NEBU
5.0000 mg | INHALATION_SOLUTION | Freq: Once | RESPIRATORY_TRACT | Status: AC
  Administered 2011-07-13: 5 mg via RESPIRATORY_TRACT
  Filled 2011-07-13 (×2): qty 1

## 2011-07-13 NOTE — ED Notes (Signed)
Asthma, SOB.

## 2011-07-13 NOTE — Discharge Instructions (Signed)
PLEASE BE SURE TO CHECK YOUR CBC (WHITE COUNT) WITHIN ONE WEEK WITH YOUR PRIMARY DOCTOR   Chronic Obstructive Pulmonary Disease Chronic obstructive pulmonary disease (COPD) is a condition in which airflow from the lungs is restricted. The lungs can never return to normal, but there are measures you can take which will improve them and make you feel better. CAUSES   Smoking.   Exposure to secondhand smoke.   Breathing in irritants (pollution, cigarette smoke, strong smells, aerosol sprays, paint fumes).   History of lung infections.  TREATMENT  Treatment focuses on making you comfortable (supportive care). Your caregiver may prescribe medications (inhaled or pills) to help improve your breathing. HOME CARE INSTRUCTIONS   If you smoke, stop smoking.   Avoid exposure to smoke, chemicals, and fumes that aggravate your breathing.   Take antibiotic medicines as directed by your caregiver.   Avoid medicines that dry up your system and slow down the elimination of secretions (antihistamines and cough syrups). This decreases respiratory capacity and may lead to infections.   Drink enough water and fluids to keep your urine clear or pale yellow. This loosens secretions.   Use humidifiers at home and at your bedside if they do not make breathing difficult.   Receive all protective vaccines your caregiver suggests, especially pneumococcal and influenza.   Use home oxygen as suggested.   Stay active. Exercise and physical activity will help maintain your ability to do things you want to do.   Eat a healthy diet.  SEEK MEDICAL CARE IF:   You develop pus-like mucus (sputum).   Breathing is more labored or exercise becomes difficult to do.   You are running out of the medicine you take for your breathing.  SEEK IMMEDIATE MEDICAL CARE IF:   You have a rapid heart rate.   You have agitation, confusion, tremors, or are in a stupor (family members may need to observe this).   It becomes  difficult to breathe.   You develop chest pain.   You have a fever.  MAKE SURE YOU:   Understand these instructions.   Will watch your condition.   Will get help right away if you are not doing well or get worse.  Document Released: 12/20/2004 Document Revised: 03/01/2011 Document Reviewed: 05/12/2010 [facility] Patient Information 2012 [place], [place].

## 2011-07-13 NOTE — ED Notes (Signed)
Pt ambulated in hall, no SOB. Pt reports he feels much better. VSS upon return to room. Pt with expiratory wheezes bilat.

## 2011-07-13 NOTE — ED Provider Notes (Signed)
History   This chart was scribed for Melvin Gaskins, MD by Cherlynn Perches. The patient was seen in room 6. Patient's care was started at 0939.  CSN: [id number]  Arrival date & time 07/13/11  4098   First MD Initiated Contact with Patient 07/13/11 [phone]      Chief Complaint  Patient presents with  . Asthma     HPI A Level 5 Caveat Applies due to Urgent Need for Intervention [name] is a 56 y.o. male with a h/o asthma, bronchitis, and COPD who presents to the Emergency Department complaining of 2 days of severe shortness of breath. Pt reports no relief from albuterol treatment.  He is unable to provide any further details due to coughing and his shortness of breath    Past Medical History  Diagnosis Date  . S/P endoscopy July 21, 2010    Dr. Jena Gauss: limited endoscopy, secondary to food impaction, Schatzki's ring seen  . Bronchitis   . COPD (chronic obstructive pulmonary disease)   . Asthma   . GERD (gastroesophageal reflux disease)     Past Surgical History  Procedure Date  . Lipoma removal from posterior neck     Family History  Problem Relation Age of Onset  . Bone cancer Mother     deceased in late 84s  . Aneurysm Father     deceased late 73s, brain  . Cancer Brother   . COPD Sister     History  Substance Use Topics  . Smoking status: Former Smoker -- 0.5 packs/day    Types: Cigarettes  . Smokeless tobacco: Former Neurosurgeon    Quit date: 03/11/2010  . Alcohol Use: Yes     6 pack a week sometime a couple of shots      Review of Systems Unable to perform ROS due to urgency of condition Allergies  Corn-containing products; Other; and Peanut-containing drug products  Home Medications   Current Outpatient Rx  Name Route Sig Dispense Refill  . ALBUTEROL SULFATE HFA 108 (90 BASE) MCG/ACT IN AERS Inhalation Inhale 2 puffs into the lungs every 6 (six) hours as needed for wheezing. 1 Inhaler 0  . ALBUTEROL SULFATE (2.5 MG/3ML) 0.083% IN NEBU Nebulization  Take 2.5 mg by nebulization every 6 (six) hours as needed. For shortness of breath and/or wheezing     . RANITIDINE HCL 150 MG PO TABS Oral Take 150 mg by mouth 2 (two) times daily.      BP 122/82  Pulse 79  Temp(Src) 97.8 F (36.6 C) (Oral)  Resp 20  SpO2 98% BP 111/77  Pulse 82  Temp(Src) 97.8 F (36.6 C) (Oral)  Resp 20  SpO2 95%   Physical Exam CONSTITUTIONAL: Well developed/well nourished, anxious HEAD AND FACE: Normocephalic/atraumatic EYES: EOMI/PERRL ENMT: Mucous membranes moist NECK: supple no meningeal signs SPINE:entire spine nontender CV: S1/S2 noted, no murmurs/rubs/gallops noted LUNGS: wheezing bilaterally, tachypnea, in distress, only able to speak in short sentences. ABDOMEN: soft, nontender, no rebound or guarding NEURO: Pt is awake/alert, moves all extremitiesx4 EXTREMITIES: pulses normal, full ROM, no edema SKIN: warm, color normal PSYCH: no abnormalities of mood noted   ED Course  Procedures   DIAGNOSTIC STUDIES: Oxygen Saturation is 96% on room air, adequate by my interpretation.    COORDINATION OF CARE: 9:42AM - Pt in apparent distress. Will start breathing treatment. 10:40 AM-Pt appears more comfortable with resp. Treatment 10:55AM - reevalutated pt and condition improved. Additional history obtained. Patient denies associated chest pain, syncope and hemoptysis  with cough and SOB he is currently experiencing.   Pt much improved Walked around ED in no distress Lung sounds improved though with some residual wheeze He is speaking in clear sentences I feel he is safe for d/c, continue albuterol and prednisone Of note, he had leukopenia without fever (has had some leukopenia on prior labs) advised close PCP eval in one week Also given zpack as this is most likely not to cause leukopenia  The patient appears reasonably screened and/or stabilized for discharge and I doubt any other medical condition or other [facility] requiring further screening,  evaluation, or treatment in the ED at this time prior to discharge.   Labs Reviewed  DIFFERENTIAL - Abnormal; Notable for the following:    Neutrophils Relative 26 (*)    Neutro Abs 1.0 (*)    Eosinophils Relative 21 (*)    Eosinophils Absolute 0.8 (*)    Basophils Relative 2 (*)    All other components within normal limits  BASIC METABOLIC PANEL - Abnormal; Notable for the following:    GFR calc non Af Amer 75 (*)    GFR calc Af Amer 87 (*)    All other components within normal limits  CBC  BLOOD GAS, ARTERIAL     MDM  Nursing notes reviewed and considered in documentation xrays reviewed and considered All labs/vitals reviewed and considered Previous records reviewed and considered      Date: 07/13/2011  Rate: 89  Rhythm: normal sinus rhythm  QRS Axis: normal  Intervals: normal  ST/T Wave abnormalities: nonspecific ST changes  Conduction Disutrbances:none    I personally performed the services described in this documentation, which was scribed in my presence. The recorded information has been reviewed and considered.      Melvin Gaskins, MD 07/13/11 1316

## 2011-12-19 ENCOUNTER — Emergency Department (HOSPITAL_COMMUNITY): Payer: Medicare Other

## 2011-12-19 ENCOUNTER — Encounter (HOSPITAL_COMMUNITY): Payer: Self-pay | Admitting: *Deleted

## 2011-12-19 ENCOUNTER — Inpatient Hospital Stay (HOSPITAL_COMMUNITY)
Admission: EM | Admit: 2011-12-19 | Discharge: 2011-12-21 | DRG: 190 | Disposition: A | Discharge status: Home / Self Care | Payer: Medicare Other | Attending: Internal Medicine | Admitting: Internal Medicine

## 2011-12-19 DIAGNOSIS — J449 Chronic obstructive pulmonary disease, unspecified: Secondary | ICD-10-CM

## 2011-12-19 DIAGNOSIS — J441 Chronic obstructive pulmonary disease with (acute) exacerbation: Principal | ICD-10-CM | POA: Diagnosis present

## 2011-12-19 DIAGNOSIS — J9600 Acute respiratory failure, unspecified whether with hypoxia or hypercapnia: Secondary | ICD-10-CM

## 2011-12-19 DIAGNOSIS — Z1212 Encounter for screening for malignant neoplasm of rectum: Secondary | ICD-10-CM

## 2011-12-19 DIAGNOSIS — F17200 Nicotine dependence, unspecified, uncomplicated: Secondary | ICD-10-CM | POA: Diagnosis present

## 2011-12-19 DIAGNOSIS — D649 Anemia, unspecified: Secondary | ICD-10-CM

## 2011-12-19 DIAGNOSIS — Z23 Encounter for immunization: Secondary | ICD-10-CM

## 2011-12-19 DIAGNOSIS — J45901 Unspecified asthma with (acute) exacerbation: Principal | ICD-10-CM | POA: Diagnosis present

## 2011-12-19 DIAGNOSIS — K219 Gastro-esophageal reflux disease without esophagitis: Secondary | ICD-10-CM | POA: Diagnosis present

## 2011-12-19 DIAGNOSIS — Z1211 Encounter for screening for malignant neoplasm of colon: Secondary | ICD-10-CM

## 2011-12-19 LAB — COMPREHENSIVE METABOLIC PANEL
ALT: 17 U/L (ref 0–53)
AST: 19 U/L (ref 0–37)
Albumin: 4.5 g/dL (ref 3.5–5.2)
Alkaline Phosphatase: 52 U/L (ref 39–117)
BUN: 9 mg/dL (ref 6–23)
CO2: 25 mEq/L (ref 19–32)
Calcium: 9.8 mg/dL (ref 8.4–10.5)
Chloride: 103 mEq/L (ref 96–112)
Creatinine, Ser: 1.01 mg/dL (ref 0.50–1.35)
GFR calc Af Amer: >90 mL/min (ref >90)
GFR calc non Af Amer: 81 mL/min — ABNORMAL LOW (ref >90)
Glucose, Bld: 106 mg/dL — ABNORMAL HIGH (ref 70–99)
Potassium: 4 mEq/L (ref 3.5–5.1)
Sodium: 138 mEq/L (ref 135–145)
Total Bilirubin: 0.3 mg/dL (ref 0.3–1.2)
Total Protein: 7.6 g/dL (ref 6.0–8.3)

## 2011-12-19 LAB — BLOOD GAS, ARTERIAL
Acid-base deficit: 2 mmol/L (ref 0.0–2.0)
Bicarbonate: 22.5 mEq/L (ref 20.0–24.0)
Delivery systems: POSITIVE
Drawn by: 22223
Expiratory PAP: 8
FIO2: 24 %
Inspiratory PAP: 14
O2 Saturation: 94.6 %
Patient temperature: 37
RATE: 14 resp/min
TCO2: 20 mmol/L (ref 0–100)
pCO2 arterial: 39.8 mmHg (ref 35.0–45.0)
pH, Arterial: 7.37 (ref 7.350–7.450)
pO2, Arterial: 71.1 mmHg — ABNORMAL LOW (ref 80.0–100.0)

## 2011-12-19 LAB — CBC WITH DIFFERENTIAL/PLATELET
Basophils Absolute: 0.1 10*3/uL (ref 0.0–0.1)
Basophils Relative: 1 % (ref 0–1)
Eosinophils Absolute: 0.9 10*3/uL — ABNORMAL HIGH (ref 0.0–0.7)
Eosinophils Relative: 16 % — ABNORMAL HIGH (ref 0–5)
HCT: 43.1 % (ref 39.0–52.0)
Hemoglobin: 14.8 g/dL (ref 13.0–17.0)
Lymphocytes Relative: 34 % (ref 12–46)
Lymphs Abs: 1.9 10*3/uL (ref 0.7–4.0)
MCH: 30.3 pg (ref 26.0–34.0)
MCHC: 34.3 g/dL (ref 30.0–36.0)
MCV: 88.1 fL (ref 78.0–100.0)
Monocytes Absolute: 0.4 10*3/uL (ref 0.1–1.0)
Monocytes Relative: 7 % (ref 3–12)
Neutro Abs: 2.5 10*3/uL (ref 1.7–7.7)
Neutrophils Relative %: 42 % — ABNORMAL LOW (ref 43–77)
Platelets: 225 10*3/uL (ref 150–400)
RBC: 4.89 MIL/uL (ref 4.22–5.81)
RDW: 12.8 % (ref 11.5–15.5)
WBC: 5.8 10*3/uL (ref 4.0–10.5)

## 2011-12-19 LAB — PRO B NATRIURETIC PEPTIDE: Pro B Natriuretic peptide (BNP): 26.3 pg/mL (ref 0–125)

## 2011-12-19 MED ORDER — IPRATROPIUM BROMIDE 0.02 % IN SOLN: 0.5000 mg | Freq: Once | RESPIRATORY_TRACT | Status: DC

## 2011-12-19 MED ORDER — ALBUTEROL SULFATE (5 MG/ML) 0.5% IN NEBU
5.0000 mg | INHALATION_SOLUTION | RESPIRATORY_TRACT | Status: AC
  Administered 2011-12-19: 5 mg via RESPIRATORY_TRACT
  Filled 2011-12-19: qty 1

## 2011-12-19 MED ORDER — ALBUTEROL (5 MG/ML) CONTINUOUS INHALATION SOLN: 15.0000 mg/h | INHALATION_SOLUTION | Freq: Once | RESPIRATORY_TRACT | Status: DC

## 2011-12-19 MED ORDER — ALBUTEROL SULFATE (5 MG/ML) 0.5% IN NEBU
10.0000 mg | INHALATION_SOLUTION | Freq: Once | RESPIRATORY_TRACT | Status: AC
  Administered 2011-12-19: 10 mg via RESPIRATORY_TRACT
  Filled 2011-12-19: qty 2

## 2011-12-19 MED ORDER — IPRATROPIUM BROMIDE 0.02 % IN SOLN
1.0000 mg | Freq: Once | RESPIRATORY_TRACT | Status: AC
  Administered 2011-12-19: 1 mg via RESPIRATORY_TRACT
  Filled 2011-12-19: qty 5

## 2011-12-19 MED ORDER — SODIUM CHLORIDE 0.9 % IV SOLN
INTRAVENOUS | Status: DC
  Administered 2011-12-19: 23:00:00 via INTRAVENOUS

## 2011-12-19 MED ORDER — SODIUM CHLORIDE 0.9 % IV BOLUS (SEPSIS)
500.0000 mL | Freq: Once | INTRAVENOUS | Status: AC
  Administered 2011-12-19: 500 mL via INTRAVENOUS

## 2011-12-19 MED ORDER — METHYLPREDNISOLONE SODIUM SUCC 125 MG IJ SOLR
125.0000 mg | Freq: Once | INTRAMUSCULAR | Status: AC
  Administered 2011-12-19: 125 mg via INTRAVENOUS
  Filled 2011-12-19: qty 2

## 2011-12-19 MED ORDER — IPRATROPIUM BROMIDE 0.02 % IN SOLN
0.5000 mg | Freq: Once | RESPIRATORY_TRACT | Status: AC
  Administered 2011-12-19: 0.5 mg via RESPIRATORY_TRACT
  Filled 2011-12-19: qty 2.5

## 2011-12-19 NOTE — H&P (Signed)
[name] is an 56 y.o. male.    PCP: [name]  Chief Complaint: Shortness of breath for the last few days  HPI: This is a 56 year old, African American male, with a past medical history of COPD, who still smokes occasionally. Patient presented with a few day history of worsening shortness of breath, wheezing, cough. He also had some chest pain because of his symptoms. He presented in acute respiratory distress. He had to be placed on a BiPAP and then he started stabilizing. He is still on BiPAP. History is limited at this time because of patient's respiratory status. Patient denied any fever or chills. Denies any sick contacts. He does tell me that for the last month and a, half he's been having some numbness in the hands, but this is only present once in a while. He doesn't have those symptoms currently. Denies any similar symptoms in the legs. Denies any focal weakness. Denies any headaches. His chest pain, is much better.   Home Medications: Prior to Admission medications   Medication Sig Start Date End Date Taking? Authorizing Provider  albuterol (PROVENTIL) (2.5 MG/3ML) 0.083% nebulizer solution Take 2.5 mg by nebulization every 6 (six) hours as needed. For shortness of breath and/or wheezing    Yes Historical Provider, MD    Allergies:  Allergies  Allergen Reactions  . Corn-Containing Products Itching  . Other     Squash and Tomatoes cause itching and whelps.   . Peanut-Containing Drug Products Other (See Comments)    Itching and Whelps    Past Medical History: Past Medical History  Diagnosis Date  . S/P endoscopy July 21, 2010    Dr. Jena Gauss: limited endoscopy, secondary to food impaction, Schatzki's ring seen  . Bronchitis   . COPD (chronic obstructive pulmonary disease)   . Asthma   . GERD (gastroesophageal reflux disease)     Past Surgical History  Procedure Date  . Lipoma removal from posterior neck     Social History:  reports that he has quit  smoking. His smoking use included Cigarettes. He smoked .5 packs per day. He quit smokeless tobacco use about 21 months ago. He reports that he drinks alcohol. He reports that he uses illicit drugs (Marijuana) about twice per week.  Family History:  Family History  Problem Relation Age of Onset  . Bone cancer Mother     deceased in late 109s  . Aneurysm Father     deceased late 73s, brain  . Cancer Brother   . COPD Sister     Review of Systems - unobtainable from patient due to respiratory status  Physical Examination Blood pressure 147/96, pulse 84, resp. rate 16, height 5\' 7"  (1.702 m), weight 65.772 kg (145 lb), SpO2 98.00%.  General appearance: alert, cooperative, appears stated age and no distress Head: Normocephalic, without obvious abnormality, atraumatic Eyes: conjunctivae/corneas clear. PERRL, EOM's intact.  Neck: no adenopathy, no carotid bruit, no JVD, supple, symmetrical, trachea midline and thyroid not enlarged, symmetric, no tenderness/mass/nodules Resp: Decreased air entry at the bases. He has diffuse end expiratory wheezing bilaterally with a few rhonchi. No definite crackles. Cardio: regular rate and rhythm, S1, S2 normal, no murmur, click, rub or gallop GI: soft, non-tender; bowel sounds normal; no masses,  no organomegaly Extremities: extremities normal, atraumatic, no cyanosis or edema Pulses: 2+ and symmetric Skin: Skin color, texture, turgor normal. No rashes or lesions Lymph nodes: Cervical, supraclavicular, and axillary nodes normal. Neurologic: He is alert and oriented x3. No cranial  nerve deficits. No focal neurological deficits. He had good strength in both his hands. No obvious sensory deficits were appreciated.  Laboratory Data: Results for orders placed during the hospital encounter of 12/19/11 (from the past 48 hour(s))  CBC WITH DIFFERENTIAL     Status: Abnormal   Collection Time   12/19/11  9:57 PM      Component Value Range Comment   WBC 5.8  4.0 -  10.5 K/uL    RBC 4.89  4.22 - 5.81 MIL/uL    Hemoglobin 14.8  13.0 - 17.0 g/dL    HCT 16.1  09.6 - 04.5 %    MCV 88.1  78.0 - 100.0 fL    MCH 30.3  26.0 - 34.0 pg    MCHC 34.3  30.0 - 36.0 g/dL    RDW 40.9  81.1 - 91.4 %    Platelets 225  150 - 400 K/uL    Neutrophils Relative 42 (*) 43 - 77 %    Neutro Abs 2.5  1.7 - 7.7 K/uL    Lymphocytes Relative 34  12 - 46 %    Lymphs Abs 1.9  0.7 - 4.0 K/uL    Monocytes Relative 7  3 - 12 %    Monocytes Absolute 0.4  0.1 - 1.0 K/uL    Eosinophils Relative 16 (*) 0 - 5 %    Eosinophils Absolute 0.9 (*) 0.0 - 0.7 K/uL    Basophils Relative 1  0 - 1 %    Basophils Absolute 0.1  0.0 - 0.1 K/uL   COMPREHENSIVE METABOLIC PANEL     Status: Abnormal   Collection Time   12/19/11  9:57 PM      Component Value Range Comment   Sodium 138  135 - 145 mEq/L    Potassium 4.0  3.5 - 5.1 mEq/L    Chloride 103  96 - 112 mEq/L    CO2 25  19 - 32 mEq/L    Glucose, Bld 106 (*) 70 - 99 mg/dL    BUN 9  6 - 23 mg/dL    Creatinine, Ser 7.82  0.50 - 1.35 mg/dL    Calcium 9.8  8.4 - 95.6 mg/dL    Total Protein 7.6  6.0 - 8.3 g/dL    Albumin 4.5  3.5 - 5.2 g/dL    AST 19  0 - 37 U/L    ALT 17  0 - 53 U/L    Alkaline Phosphatase 52  39 - 117 U/L    Total Bilirubin 0.3  0.3 - 1.2 mg/dL    GFR calc non Af Amer 81 (*) >90 mL/min    GFR calc Af Amer >90  >90 mL/min   PRO B NATRIURETIC PEPTIDE     Status: Normal   Collection Time   12/19/11  9:57 PM      Component Value Range Comment   Pro B Natriuretic peptide (BNP) 26.3  0 - 125 pg/mL   BLOOD GAS, ARTERIAL     Status: Abnormal   Collection Time   12/19/11 11:05 PM      Component Value Range Comment   FIO2 24.00      Delivery systems BILEVEL POSITIVE AIRWAY PRESSURE      Rate 14      Inspiratory PAP 14      Expiratory PAP 8      pH, Arterial 7.370  7.350 - 7.450    pCO2 arterial 39.8  35.0 - 45.0 mmHg  pO2, Arterial 71.1 (*) 80.0 - 100.0 mmHg    Bicarbonate 22.5  20.0 - 24.0 mEq/L    TCO2 20.0  0 - 100  mmol/L    Acid-base deficit 2.0  0.0 - 2.0 mmol/L    O2 Saturation 94.6      Patient temperature 37.0      Collection site RIGHT RADIAL      Drawn by [id number]      Sample type ARTERIAL      Allens test (pass/fail) PASS  PASS     Radiology Reports: Dg Chest [place] 1 View  12/19/2011  *RADIOLOGY REPORT*  Clinical Data: 56 year old male shortness of breath.  PORTABLE CHEST - 1 VIEW  Comparison: 07/13/2011 and earlier.  Findings: Portable semi upright AP view 2208 hours.  Stable lung volumes.  Cardiac size and mediastinal contours are within normal limits.  Visualized tracheal air column is within normal limits. No pneumothorax, pulmonary edema, pleural effusion or acute pulmonary opacity.  Mild bilateral perihilar scarring unchanged. Lower lobe predominant emphysema suspected.  IMPRESSION: No acute cardiopulmonary abnormality.   Original Report Authenticated By: Harley Hallmark, M.D.      Assessment/Plan  Principal Problem:  *COPD with acute exacerbation Active Problems:  Acute respiratory failure   #1 COPD with acute exacerbation with acute respiratory failure: Continue BiPAP for tonight. We will place him on albuterol and Atrovent nebulizer treatments. He'll be given intravenous antibiotics as well as steroids. Am anticipating he will be able to come off of the BiPAP in the morning. In the meantime, we will monitor him in the step down unit. Chest discomfort is probably as a result of his acute respiratory distress. This is improving.  #2 paresthesias in the hands: He doesn't have the symptoms currently. I have asked him to discuss these symptoms with his primary care providers.  DVT, prophylaxis with Lovenox.  Patient is a full code.   Further management decisions will depend on results of further testing and patient's response to treatment.  [facility]  [facility] Pager [phone]  12/19/2011, 11:57 PM

## 2011-12-19 NOTE — ED Provider Notes (Signed)
History    This chart was scribed for Ward Givens, MD, MD by Smitty Pluck. The patient was seen in room APA09 and the patient's care was started at 9:52 PM.   CSN: [id number]  Arrival date & time 12/19/11  2102    Chief Complaint  Patient presents with  . Shortness of Breath  . Wheezing  . Asthma    (Consider location/radiation/quality/duration/timing/severity/associated sxs/prior treatment) The history is provided by the patient. No language interpreter was used.   Pt is a level 5 caveat due to acute respiratory distress  CODI KERTZ is a 56 y.o. male who presents to the Emergency Department with hx of asthma, COPD and bronchitis complaining of constant, moderate SOB and wheezing onset today, earlier this evening Pt has breathing treatment machine at home. Pt denies using O2 at home. Denies any other pain currently. Pt was admitted for breathing complications in April 2013. Pt reports that he smokes cigarettes sometimes.  [facility] [place]    Past Medical History  Diagnosis Date  . S/P endoscopy July 21, 2010    Dr. Jena Gauss: limited endoscopy, secondary to food impaction, Schatzki's ring seen  . Bronchitis   . COPD (chronic obstructive pulmonary disease)   . Asthma   . GERD (gastroesophageal reflux disease)     Past Surgical History  Procedure Date  . Lipoma removal from posterior neck     Family History  Problem Relation Age of Onset  . Bone cancer Mother     deceased in late 73s  . Aneurysm Father     deceased late 92s, brain  . Cancer Brother   . COPD Sister     History  Substance Use Topics  . cigarettes yes    Types: Cigarettes  . Smokeless tobacco: Former Neurosurgeon    Quit date: 03/11/2010  . Alcohol Use: Yes     6 pack a week sometime a couple of shots   Pt reports that he is on disability   Review of Systems  Unable to perform ROS: Other    Allergies  Corn-containing products; Other; and Peanut-containing drug  products  Home Medications   Current Outpatient Rx  Name Route Sig Dispense Refill  . ALBUTEROL SULFATE (2.5 MG/3ML) 0.083% IN NEBU Nebulization Take 2.5 mg by nebulization every 6 (six) hours as needed. For shortness of breath and/or wheezing       BP 147/96  Pulse 84  Resp 16  Ht 5\' 7"  (1.702 m)  Wt 145 lb (65.772 kg)  BMI 22.71 kg/m2  SpO2 98%  Vital signs normal    Physical Exam  Nursing note and vitals reviewed. Constitutional: He is oriented to person, place, and time. He appears well-developed and well-nourished.  Non-toxic appearance. He does not appear ill. He appears distressed.  HENT:  Head: Normocephalic and atraumatic.  Right Ear: External ear normal.  Left Ear: External ear normal.  Nose: Nose normal. No mucosal edema or rhinorrhea.  Mouth/Throat: Oropharynx is clear and moist and mucous membranes are normal. No dental abscesses or uvula swelling.  Eyes: Conjunctivae normal and EOM are normal. Pupils are equal, round, and reactive to light.  Neck: Normal range of motion and full passive range of motion without pain. Neck supple.  Cardiovascular: Normal rate, regular rhythm and normal heart sounds.  Exam reveals no gallop and no friction rub.   No murmur heard. Pulmonary/Chest: He is in respiratory distress. He has wheezes (diffuse expiratory ). He has no  rhonchi. He has no rales. He exhibits no tenderness and no crepitus.       Retractions  Barrel chest Diminished breath sounds Diffuse prolonged expiratory wheezing bilaterally     Abdominal: Soft. Normal appearance and bowel sounds are normal. He exhibits no distension. There is no tenderness. There is no rebound and no guarding.  Musculoskeletal: Normal range of motion. He exhibits no edema and no tenderness.       Moves all extremities well.   Neurological: He is alert and oriented to person, place, and time. He has normal strength. No cranial nerve deficit.  Skin: Skin is warm and intact. No rash noted. He  is diaphoretic. No erythema. No pallor.  Psychiatric: He has a normal mood and affect. His speech is normal and behavior is normal. His mood appears not anxious.    ED Course  Procedures (including critical care time) DIAGNOSTIC STUDIES: Oxygen Saturation is 93% on [address], adequate by my interpretation.    COORDINATION OF CARE: 11:44 PM Discussed ED treatment with pt   11:44 PM Ordered:   Medications  albuterol (PROVENTIL,VENTOLIN) solution continuous neb (  Nebulization Canceled Entry 12/19/11 2224)  ipratropium (ATROVENT) nebulizer solution 0.5 mg (  Nebulization Canceled Entry 12/19/11 2224)  0.9 %  sodium chloride infusion (  Intravenous New Bag/Given 12/19/11 2245)  albuterol (PROVENTIL) (5 MG/ML) 0.5% nebulizer solution 5 mg (5 mg Nebulization Given 12/19/11 2116)  ipratropium (ATROVENT) nebulizer solution 0.5 mg (0.5 mg Nebulization Given 12/19/11 2116)  albuterol (PROVENTIL) (5 MG/ML) 0.5% nebulizer solution 10 mg (10 mg Nebulization Given 12/19/11 2128)  ipratropium (ATROVENT) nebulizer solution 1 mg (1 mg Nebulization Given 12/19/11 2127)  sodium chloride 0.9 % bolus 500 mL (500 mL Intravenous Given 12/19/11 2218)  methylPREDNISolone sodium succinate (SOLU-MEDROL) 125 mg/2 mL injection 125 mg (125 mg Intravenous Given 12/19/11 2218)   At time of my exam patient was on second nebulizer and in moderately severe respiratory distsress, he was placed on bipap and given continuous nebulizer for his severe respiratory distress.   10:55PM Recheck. Pt is on BIPAP and is getting continuous albuterol. Improved air movement but still has diffuse expiratory wheezing. Pt reports he is feeling better.   2335 Dr Barnie Del, admit to stepdown, team 2   Results for orders placed during the hospital encounter of 12/19/11  CBC WITH DIFFERENTIAL      Component Value Range   WBC 5.8  4.0 - 10.5 K/uL   RBC 4.89  4.22 - 5.81 MIL/uL   Hemoglobin 14.8  13.0 - 17.0 g/dL   HCT 19.1  47.8 - 29.5 %   MCV 88.1   78.0 - 100.0 fL   MCH 30.3  26.0 - 34.0 pg   MCHC 34.3  30.0 - 36.0 g/dL   RDW 62.1  30.8 - 65.7 %   Platelets 225  150 - 400 K/uL   Neutrophils Relative 42 (*) 43 - 77 %   Neutro Abs 2.5  1.7 - 7.7 K/uL   Lymphocytes Relative 34  12 - 46 %   Lymphs Abs 1.9  0.7 - 4.0 K/uL   Monocytes Relative 7  3 - 12 %   Monocytes Absolute 0.4  0.1 - 1.0 K/uL   Eosinophils Relative 16 (*) 0 - 5 %   Eosinophils Absolute 0.9 (*) 0.0 - 0.7 K/uL   Basophils Relative 1  0 - 1 %   Basophils Absolute 0.1  0.0 - 0.1 K/uL  COMPREHENSIVE METABOLIC PANEL  Component Value Range   Sodium 138  135 - 145 mEq/L   Potassium 4.0  3.5 - 5.1 mEq/L   Chloride 103  96 - 112 mEq/L   CO2 25  19 - 32 mEq/L   Glucose, Bld 106 (*) 70 - 99 mg/dL   BUN 9  6 - 23 mg/dL   Creatinine, Ser 1.61  0.50 - 1.35 mg/dL   Calcium 9.8  8.4 - 09.6 mg/dL   Total Protein 7.6  6.0 - 8.3 g/dL   Albumin 4.5  3.5 - 5.2 g/dL   AST 19  0 - 37 U/L   ALT 17  0 - 53 U/L   Alkaline Phosphatase 52  39 - 117 U/L   Total Bilirubin 0.3  0.3 - 1.2 mg/dL   GFR calc non Af Amer 81 (*) >90 mL/min   GFR calc Af Amer >90  >90 mL/min  PRO B NATRIURETIC PEPTIDE      Component Value Range   Pro B Natriuretic peptide (BNP) 26.3  0 - 125 pg/mL  BLOOD GAS, ARTERIAL      Component Value Range   FIO2 24.00     Delivery systems BILEVEL POSITIVE AIRWAY PRESSURE     Rate 14     Inspiratory PAP 14     Expiratory PAP 8     pH, Arterial 7.370  7.350 - 7.450   pCO2 arterial 39.8  35.0 - 45.0 mmHg   pO2, Arterial 71.1 (*) 80.0 - 100.0 mmHg   Bicarbonate 22.5  20.0 - 24.0 mEq/L   TCO2 20.0  0 - 100 mmol/L   Acid-base deficit 2.0  0.0 - 2.0 mmol/L   O2 Saturation 94.6     Patient temperature 37.0     Collection site RIGHT RADIAL     Drawn by [id number]     Sample type ARTERIAL     Allens test (pass/fail) PASS  PASS   Laboratory interpretation all normal    Dg Chest Port 1 View  12/19/2011  *RADIOLOGY REPORT*  Clinical Data: 56 year old male shortness  of breath.  PORTABLE CHEST - 1 VIEW  Comparison: 07/13/2011 and earlier.  Findings: Portable semi upright AP view 2208 hours.  Stable lung volumes.  Cardiac size and mediastinal contours are within normal limits.  Visualized tracheal air column is within normal limits. No pneumothorax, pulmonary edema, pleural effusion or acute pulmonary opacity.  Mild bilateral perihilar scarring unchanged. Lower lobe predominant emphysema suspected.  IMPRESSION: No acute cardiopulmonary abnormality.   Original Report Authenticated By: Harley Hallmark, M.D.     1. COPD with exacerbation    Plan admission   CRITICAL CARE Performed by: Maly Lemarr L   Total critical care time: 45 min  Critical care time was exclusive of separately billable procedures and treating other patients.  Critical care was necessary to treat or prevent imminent or life-threatening deterioration.  Critical care was time spent personally by me on the following activities: development of treatment plan with patient and/or surrogate as well as nursing, discussions with consultants, evaluation of patient's response to treatment, examination of patient, obtaining history from patient or surrogate, ordering and performing treatments and interventions, ordering and review of laboratory studies, ordering and review of radiographic studies, pulse oximetry and re-evaluation of patient's condition.    MDM   I personally performed the services described in this documentation, which was scribed in my presence. The recorded information has been reviewed and considered.  Devoria Albe, MD, FACEP    Jodelle Gross  Hildred Laser, MD 12/19/11 2348

## 2011-12-19 NOTE — ED Notes (Signed)
Pt reports asthma problems since yesterday. audible wheezing in triage.

## 2011-12-20 ENCOUNTER — Encounter (HOSPITAL_COMMUNITY): Payer: Self-pay | Admitting: Intensive Care

## 2011-12-20 LAB — COMPREHENSIVE METABOLIC PANEL
ALT: 15 U/L (ref 0–53)
AST: 16 U/L (ref 0–37)
Albumin: 4 g/dL (ref 3.5–5.2)
Alkaline Phosphatase: 49 U/L (ref 39–117)
BUN: 8 mg/dL (ref 6–23)
CO2: 23 mEq/L (ref 19–32)
Calcium: 9.3 mg/dL (ref 8.4–10.5)
Chloride: 103 mEq/L (ref 96–112)
Creatinine, Ser: 0.89 mg/dL (ref 0.50–1.35)
GFR calc Af Amer: >90 mL/min (ref >90)
GFR calc non Af Amer: >90 mL/min (ref >90)
Glucose, Bld: 154 mg/dL — ABNORMAL HIGH (ref 70–99)
Potassium: 4 mEq/L (ref 3.5–5.1)
Sodium: 135 mEq/L (ref 135–145)
Total Bilirubin: 0.3 mg/dL (ref 0.3–1.2)
Total Protein: 6.7 g/dL (ref 6.0–8.3)

## 2011-12-20 LAB — CBC
HCT: 39.6 % (ref 39.0–52.0)
Hemoglobin: 13.7 g/dL (ref 13.0–17.0)
MCH: 30.7 pg (ref 26.0–34.0)
MCHC: 34.6 g/dL (ref 30.0–36.0)
MCV: 88.8 fL (ref 78.0–100.0)
Platelets: 231 10*3/uL (ref 150–400)
RBC: 4.46 MIL/uL (ref 4.22–5.81)
RDW: 12.8 % (ref 11.5–15.5)
WBC: 6.2 10*3/uL (ref 4.0–10.5)

## 2011-12-20 LAB — MRSA PCR SCREENING: MRSA by PCR: NEGATIVE

## 2011-12-20 MED ORDER — SODIUM CHLORIDE 0.9 % IV SOLN
INTRAVENOUS | Status: DC
  Administered 2011-12-20 (×2): via INTRAVENOUS

## 2011-12-20 MED ORDER — METHYLPREDNISOLONE SODIUM SUCC 125 MG IJ SOLR
60.0000 mg | Freq: Four times a day (QID) | INTRAMUSCULAR | Status: DC
  Administered 2011-12-20 – 2011-12-21 (×5): 60 mg via INTRAVENOUS
  Filled 2011-12-20 (×5): qty 2

## 2011-12-20 MED ORDER — ONDANSETRON HCL 4 MG/2ML IJ SOLN: 4.0000 mg | Freq: Four times a day (QID) | INTRAMUSCULAR | Status: DC | PRN

## 2011-12-20 MED ORDER — ALBUTEROL SULFATE (5 MG/ML) 0.5% IN NEBU: 2.5000 mg | INHALATION_SOLUTION | RESPIRATORY_TRACT | Status: DC | PRN

## 2011-12-20 MED ORDER — ONDANSETRON HCL 4 MG PO TABS
4.0000 mg | ORAL_TABLET | Freq: Four times a day (QID) | ORAL | Status: DC | PRN
  Administered 2011-12-20: 4 mg via ORAL
  Filled 2011-12-20: qty 1

## 2011-12-20 MED ORDER — ACETAMINOPHEN 325 MG PO TABS
650.0000 mg | ORAL_TABLET | Freq: Four times a day (QID) | ORAL | Status: DC | PRN
  Administered 2011-12-20 (×2): 650 mg via ORAL
  Filled 2011-12-20 (×2): qty 2

## 2011-12-20 MED ORDER — LORAZEPAM 0.5 MG PO TABS
0.5000 mg | ORAL_TABLET | Freq: Every day | ORAL | Status: DC
  Administered 2011-12-20: 0.5 mg via ORAL
  Filled 2011-12-20: qty 1

## 2011-12-20 MED ORDER — LEVOFLOXACIN IN D5W 500 MG/100ML IV SOLN
500.0000 mg | INTRAVENOUS | Status: DC
  Administered 2011-12-20 – 2011-12-21 (×2): 500 mg via INTRAVENOUS
  Filled 2011-12-20 (×2): qty 100

## 2011-12-20 MED ORDER — LEVOFLOXACIN IN D5W 500 MG/100ML IV SOLN
INTRAVENOUS | Status: AC
  Filled 2011-12-20: qty 100

## 2011-12-20 MED ORDER — LORAZEPAM 2 MG/ML IJ SOLN
0.5000 mg | Freq: Once | INTRAMUSCULAR | Status: AC
  Administered 2011-12-20: 0.5 mg via INTRAVENOUS
  Filled 2011-12-20: qty 1

## 2011-12-20 MED ORDER — SODIUM CHLORIDE 0.45 % IV SOLN
INTRAVENOUS | Status: DC
  Administered 2011-12-20: 1000 mL via INTRAVENOUS

## 2011-12-20 MED ORDER — ALBUTEROL SULFATE (5 MG/ML) 0.5% IN NEBU
2.5000 mg | INHALATION_SOLUTION | RESPIRATORY_TRACT | Status: DC
  Administered 2011-12-20 – 2011-12-21 (×8): 2.5 mg via RESPIRATORY_TRACT
  Filled 2011-12-20 (×8): qty 0.5

## 2011-12-20 MED ORDER — FLUTICASONE-SALMETEROL 250-50 MCG/DOSE IN AEPB
1.0000 | INHALATION_SPRAY | Freq: Two times a day (BID) | RESPIRATORY_TRACT | Status: DC
  Administered 2011-12-20 – 2011-12-21 (×3): 1 via RESPIRATORY_TRACT
  Filled 2011-12-20: qty 14

## 2011-12-20 MED ORDER — ENOXAPARIN SODIUM 40 MG/0.4ML ~~LOC~~ SOLN
40.0000 mg | SUBCUTANEOUS | Status: DC
  Administered 2011-12-20: 40 mg via SUBCUTANEOUS
  Filled 2011-12-20: qty 0.4

## 2011-12-20 MED ORDER — FLUTICASONE-SALMETEROL 250-50 MCG/DOSE IN AEPB
INHALATION_SPRAY | RESPIRATORY_TRACT | Status: AC
  Filled 2011-12-20: qty 14

## 2011-12-20 MED ORDER — SODIUM CHLORIDE 0.9 % IJ SOLN
3.0000 mL | Freq: Two times a day (BID) | INTRAMUSCULAR | Status: DC
  Administered 2011-12-20 (×2): 3 mL via INTRAVENOUS
  Filled 2011-12-20 (×2): qty 3

## 2011-12-20 MED ORDER — IPRATROPIUM BROMIDE 0.02 % IN SOLN
0.5000 mg | RESPIRATORY_TRACT | Status: DC
  Administered 2011-12-20 – 2011-12-21 (×9): 0.5 mg via RESPIRATORY_TRACT
  Filled 2011-12-20 (×9): qty 2.5

## 2011-12-20 MED ORDER — PNEUMOCOCCAL VAC POLYVALENT 25 MCG/0.5ML IJ INJ
0.5000 mL | INJECTION | INTRAMUSCULAR | Status: AC
  Administered 2011-12-21: 0.5 mL via INTRAMUSCULAR
  Filled 2011-12-20: qty 0.5

## 2011-12-20 MED ORDER — ACETAMINOPHEN 650 MG RE SUPP: 650.0000 mg | Freq: Four times a day (QID) | RECTAL | Status: DC | PRN

## 2011-12-20 MED ORDER — ALBUTEROL SULFATE (5 MG/ML) 0.5% IN NEBU
5.0000 mg | INHALATION_SOLUTION | RESPIRATORY_TRACT | Status: DC
  Administered 2011-12-20: 5 mg via RESPIRATORY_TRACT
  Filled 2011-12-20: qty 1

## 2011-12-20 NOTE — Progress Notes (Signed)
     Subjective: This man was admitted yesterday with exacerbation of COPD. He was placed on BiPAP. He feels much improved and looks very comfortable on BiPAP.           Physical Exam: Blood pressure 125/89, pulse 88, temperature 97.8 F (36.6 C), temperature source Axillary, resp. rate 22, height 5\' 10"  (1.778 m), weight 64 kg (141 lb 1.5 oz), SpO2 99.00%. He does look systemically well. His breathing is for comfortable. He is able to talk to me in sentences through his BiPAP. Heart sounds are present and normal without murmurs. He is alert and orientated.   Investigations:  Recent Results (from the past 240 hour(s))  MRSA PCR SCREENING     Status: Normal   Collection Time   12/20/11  1:05 AM      Component Value Range Status Comment   MRSA by PCR NEGATIVE  NEGATIVE Final      Basic Metabolic Panel:  Basename 12/20/11 0507 12/19/11 2157  NA 135 138  K 4.0 4.0  CL 103 103  CO2 23 25  GLUCOSE 154* 106*  BUN 8 9  CREATININE 0.89 1.01  CALCIUM 9.3 9.8  MG -- --  PHOS -- --   Liver Function Tests:  [facility] 12/20/11 0507 12/19/11 2157  AST 16 19  ALT 15 17  ALKPHOS 49 52  BILITOT 0.3 0.3  PROT 6.7 7.6  ALBUMIN 4.0 4.5     CBC:  Basename 12/20/11 0507 12/19/11 2157  WBC 6.2 5.8  NEUTROABS -- 2.5  HGB 13.7 14.8  HCT 39.6 43.1  MCV 88.8 88.1  PLT 231 225    Dg Chest Port 1 View  12/19/2011  *RADIOLOGY REPORT*  Clinical Data: 56 year old male shortness of breath.  PORTABLE CHEST - 1 VIEW  Comparison: 07/13/2011 and earlier.  Findings: Portable semi upright AP view 2208 hours.  Stable lung volumes.  Cardiac size and mediastinal contours are within normal limits.  Visualized tracheal air column is within normal limits. No pneumothorax, pulmonary edema, pleural effusion or acute pulmonary opacity.  Mild bilateral perihilar scarring unchanged. Lower lobe predominant emphysema suspected.  IMPRESSION: No acute cardiopulmonary abnormality.   Original Report  Authenticated By: Harley Hallmark, M.D.       Medications: I have reviewed the patient's current medications.  Impression: 1. COPD exacerbation. 2. Acute respiratory failure secondary to #1, improving.     Plan: 1. Discontinue BiPAP. 2. Continue with other current therapies including intravenous steroids and antibiotics. 3. Hopefully, if he is improved sufficiently today , he may well be able to discharge tomorrow if he is stable.     LOS: 1 day   Wilson Singer Pager [phone]  12/20/2011, 7:32 AM

## 2011-12-20 NOTE — Clinical Social Work Note (Signed)
CSW alerted RN CM about consult for need for financial assistance w medications.  RN CM will meet w patient, patient has insurance.  Santa Genera, LCSW Clinical Social Worker [phone])

## 2011-12-20 NOTE — Progress Notes (Signed)
Called Dr. Rito Ehrlich regarding patient complaining of restlessness and anxiety. Orders to follow.

## 2011-12-21 LAB — CBC
HCT: 37.2 % — ABNORMAL LOW (ref 39.0–52.0)
Hemoglobin: 12.7 g/dL — ABNORMAL LOW (ref 13.0–17.0)
MCH: 30.7 pg (ref 26.0–34.0)
MCHC: 34.1 g/dL (ref 30.0–36.0)
MCV: 89.9 fL (ref 78.0–100.0)
Platelets: 220 10*3/uL (ref 150–400)
RBC: 4.14 MIL/uL — ABNORMAL LOW (ref 4.22–5.81)
RDW: 13.2 % (ref 11.5–15.5)
WBC: 8.2 10*3/uL (ref 4.0–10.5)

## 2011-12-21 LAB — COMPREHENSIVE METABOLIC PANEL
ALT: 15 U/L (ref 0–53)
AST: 15 U/L (ref 0–37)
Albumin: 3.9 g/dL (ref 3.5–5.2)
Alkaline Phosphatase: 43 U/L (ref 39–117)
BUN: 10 mg/dL (ref 6–23)
CO2: 21 mEq/L (ref 19–32)
Calcium: 9.5 mg/dL (ref 8.4–10.5)
Chloride: 104 mEq/L (ref 96–112)
Creatinine, Ser: 0.92 mg/dL (ref 0.50–1.35)
GFR calc Af Amer: >90 mL/min (ref >90)
GFR calc non Af Amer: >90 mL/min (ref >90)
Glucose, Bld: 128 mg/dL — ABNORMAL HIGH (ref 70–99)
Potassium: 3.8 mEq/L (ref 3.5–5.1)
Sodium: 138 mEq/L (ref 135–145)
Total Bilirubin: 0.2 mg/dL — ABNORMAL LOW (ref 0.3–1.2)
Total Protein: 6.6 g/dL (ref 6.0–8.3)

## 2011-12-21 MED ORDER — INFLUENZA VIRUS VACC SPLIT PF IM SUSP
0.5000 mL | INTRAMUSCULAR | Status: AC | PRN
  Administered 2011-12-21: 0.5 mL via INTRAMUSCULAR
  Filled 2011-12-21: qty 0.5

## 2011-12-21 MED ORDER — FLUTICASONE-SALMETEROL 250-50 MCG/DOSE IN AEPB: 1.0000 | INHALATION_SPRAY | Freq: Two times a day (BID) | RESPIRATORY_TRACT | Status: DC

## 2011-12-21 MED ORDER — TIOTROPIUM BROMIDE MONOHYDRATE 18 MCG IN CAPS: 18.0000 ug | ORAL_CAPSULE | Freq: Every day | RESPIRATORY_TRACT | Status: DC

## 2011-12-21 MED ORDER — ALBUTEROL SULFATE (2.5 MG/3ML) 0.083% IN NEBU: 2.5000 mg | INHALATION_SOLUTION | Freq: Four times a day (QID) | RESPIRATORY_TRACT | Status: AC | PRN

## 2011-12-21 MED ORDER — LEVOFLOXACIN 500 MG PO TABS: 500.0000 mg | ORAL_TABLET | Freq: Every day | ORAL | Status: AC

## 2011-12-21 MED ORDER — PREDNISONE 20 MG PO TABS: ORAL_TABLET | ORAL | Status: DC

## 2011-12-21 NOTE — Progress Notes (Signed)
Pt discharged to home with friend. D/c instructions given, patient able to teach back information. Influenza and Pneumovax vaccines given prior to d/c. Iv removed, site clean dry and intact. Pt will make follow up appointment to see PCP in one week. Pt states that he knows to seek medical care for signs of infection, chest pain or shortness of breath.

## 2011-12-21 NOTE — Discharge Summary (Signed)
Physician Discharge Summary  [name] WUJ:[id number] DOB: Jul 22, 1955 DOA: 12/19/2011    Admit date: 12/19/2011 Discharge date: 12/21/2011  Recommendations for Outpatient Follow-up:  1. Follow with primary care physician.   Discharge Diagnoses:  1. COPD with acute exacerbation, resolved. 2. Acute respiratory failure requiring BiPAP temporarily, resolved.   Discharge Condition: Stable and improved.  Diet recommendation: Regular.  Filed Weights   12/20/11 0100 12/20/11 0112 12/20/11 0500  Weight: 63.4 kg (139 lb 12.4 oz) 63.4 kg (139 lb 12.4 oz) 64 kg (141 lb 1.5 oz)    History of present illness:  This very pleasant 56 year old man, who quit smoking several months ago, presented with symptoms of dyspnea for the last few days prior to hospitalization. Please see initial history as outlined below: HPI: This is a 56 year old, African American male, with a past medical history of COPD, who still smokes occasionally. Patient presented with a few day history of worsening shortness of breath, wheezing, cough. He also had some chest pain because of his symptoms. He presented in acute respiratory distress. He had to be placed on a BiPAP and then he started stabilizing. He is still on BiPAP. History is limited at this time because of patient's respiratory status. Patient denied any fever or chills. Denies any sick contacts. He does tell me that for the last month and a, half he's been having some numbness in the hands, but this is only present once in a while. He doesn't have those symptoms currently. Denies any similar symptoms in the legs. Denies any focal weakness. Denies any headaches. His chest pain, is much better.  Hospital Course:  Patient was admitted and started on intravenous steroids and antibiotics. He required BiPAP overnight but the following morning he was much more comfortable with his breathing and he was liberated from the BiPAP successfully. He has done well since that time. He  feels his breathing is back to his baseline today. He had not been taking his Advair inhaler for the last month or so.  Procedures:  None.  Consultations:  None.  Discharge Exam: Filed Vitals:   12/21/11 0418 12/21/11 0500 12/21/11 0600 12/21/11 0718  BP:  111/72 109/68   Pulse:  87 92   Temp:      TempSrc:      Resp:      Height:      Weight:      SpO2: 92% 91% 96% 100%    General: He looks systemically well. There is no peripheral central cyanosis. Cardiovascular: Heart sounds are present and normal. No murmurs. Respiratory: Lung fields are clinically clear with no evidence of wheezing, crackles or bronchial breathing. He is alert and oriented.  Discharge Instructions  Discharge Orders    Future Orders Please Complete By Expires   Diet - low sodium heart healthy      Increase activity slowly          Medication List     As of 12/21/2011  8:22 AM    TAKE these medications         albuterol (2.5 MG/3ML) 0.083% nebulizer solution   Commonly known as: PROVENTIL   Take 3 mLs (2.5 mg total) by nebulization every 6 (six) hours as needed. For shortness of breath and/or wheezing      Fluticasone-Salmeterol 250-50 MCG/DOSE Aepb   Commonly known as: ADVAIR   Inhale 1 puff into the lungs 2 (two) times daily.      levofloxacin 500 MG tablet   Commonly known  asBarbera Setters   Take 1 tablet (500 mg total) by mouth daily.      predniSONE 20 MG tablet   Commonly known as: DELTASONE   Take 2 tabs daily for 3 days,then 1 tab daily for 3 days,then 1/2 tab daily for 3 days,then STOP.      tiotropium 18 MCG inhalation capsule   Commonly known as: SPIRIVA   Place 1 capsule (18 mcg total) into inhaler and inhale daily.          The results of significant diagnostics from this hospitalization (including imaging, microbiology, ancillary and laboratory) are listed below for reference.    Significant Diagnostic Studies: Dg Chest Port 1 View  12/19/2011  *RADIOLOGY REPORT*   Clinical Data: 56 year old male shortness of breath.  PORTABLE CHEST - 1 VIEW  Comparison: 07/13/2011 and earlier.  Findings: Portable semi upright AP view 2208 hours.  Stable lung volumes.  Cardiac size and mediastinal contours are within normal limits.  Visualized tracheal air column is within normal limits. No pneumothorax, pulmonary edema, pleural effusion or acute pulmonary opacity.  Mild bilateral perihilar scarring unchanged. Lower lobe predominant emphysema suspected.  IMPRESSION: No acute cardiopulmonary abnormality.   Original Report Authenticated By: Harley Hallmark, M.D.     Microbiology: Recent Results (from the past 240 hour(s))  MRSA PCR SCREENING     Status: Normal   Collection Time   12/20/11  1:05 AM      Component Value Range Status Comment   MRSA by PCR NEGATIVE  NEGATIVE Final      Labs: Basic Metabolic Panel:  Lab 12/21/11 1610 12/20/11 0507 12/19/11 2157  NA 138 135 138  K 3.8 4.0 4.0  CL 104 103 103  CO2 21 23 25   GLUCOSE 128* 154* 106*  BUN 10 8 9   CREATININE 0.92 0.89 1.01  CALCIUM 9.5 9.3 9.8  MG -- -- --  PHOS -- -- --   Liver Function Tests:  Lab 12/21/11 0434 12/20/11 0507 12/19/11 2157  AST 15 16 19   ALT 15 15 17   ALKPHOS 43 49 52  BILITOT 0.2* 0.3 0.3  PROT 6.6 6.7 7.6  ALBUMIN 3.9 4.0 4.5     CBC:  Lab 12/21/11 0434 12/20/11 0507 12/19/11 2157  WBC 8.2 6.2 5.8  NEUTROABS -- -- 2.5  HGB 12.7* 13.7 14.8  HCT 37.2* 39.6 43.1  MCV 89.9 88.8 88.1  PLT 220 231 225     BNP: BNP (last 3 results)  Basename 12/19/11 2157  PROBNP 26.3   CBG:   Time coordinating discharge: *Less than 30 minutes  Signed:  Jamin Panther C  [facility] 12/21/2011, 8:22 AM

## 2011-12-27 NOTE — Progress Notes (Signed)
UR chart review completed.  

## 2012-10-23 IMAGING — CR DG CHEST 1V PORT
1 series · 1 of 1 positions shown · non-contrast
Comparison: Chest radiograph performed 01/26/2007

CLINICAL DATA: Shortness of breath, cough and congestion; history
of asthma.

PORTABLE CHEST - 1 VIEW

[view not recorded]
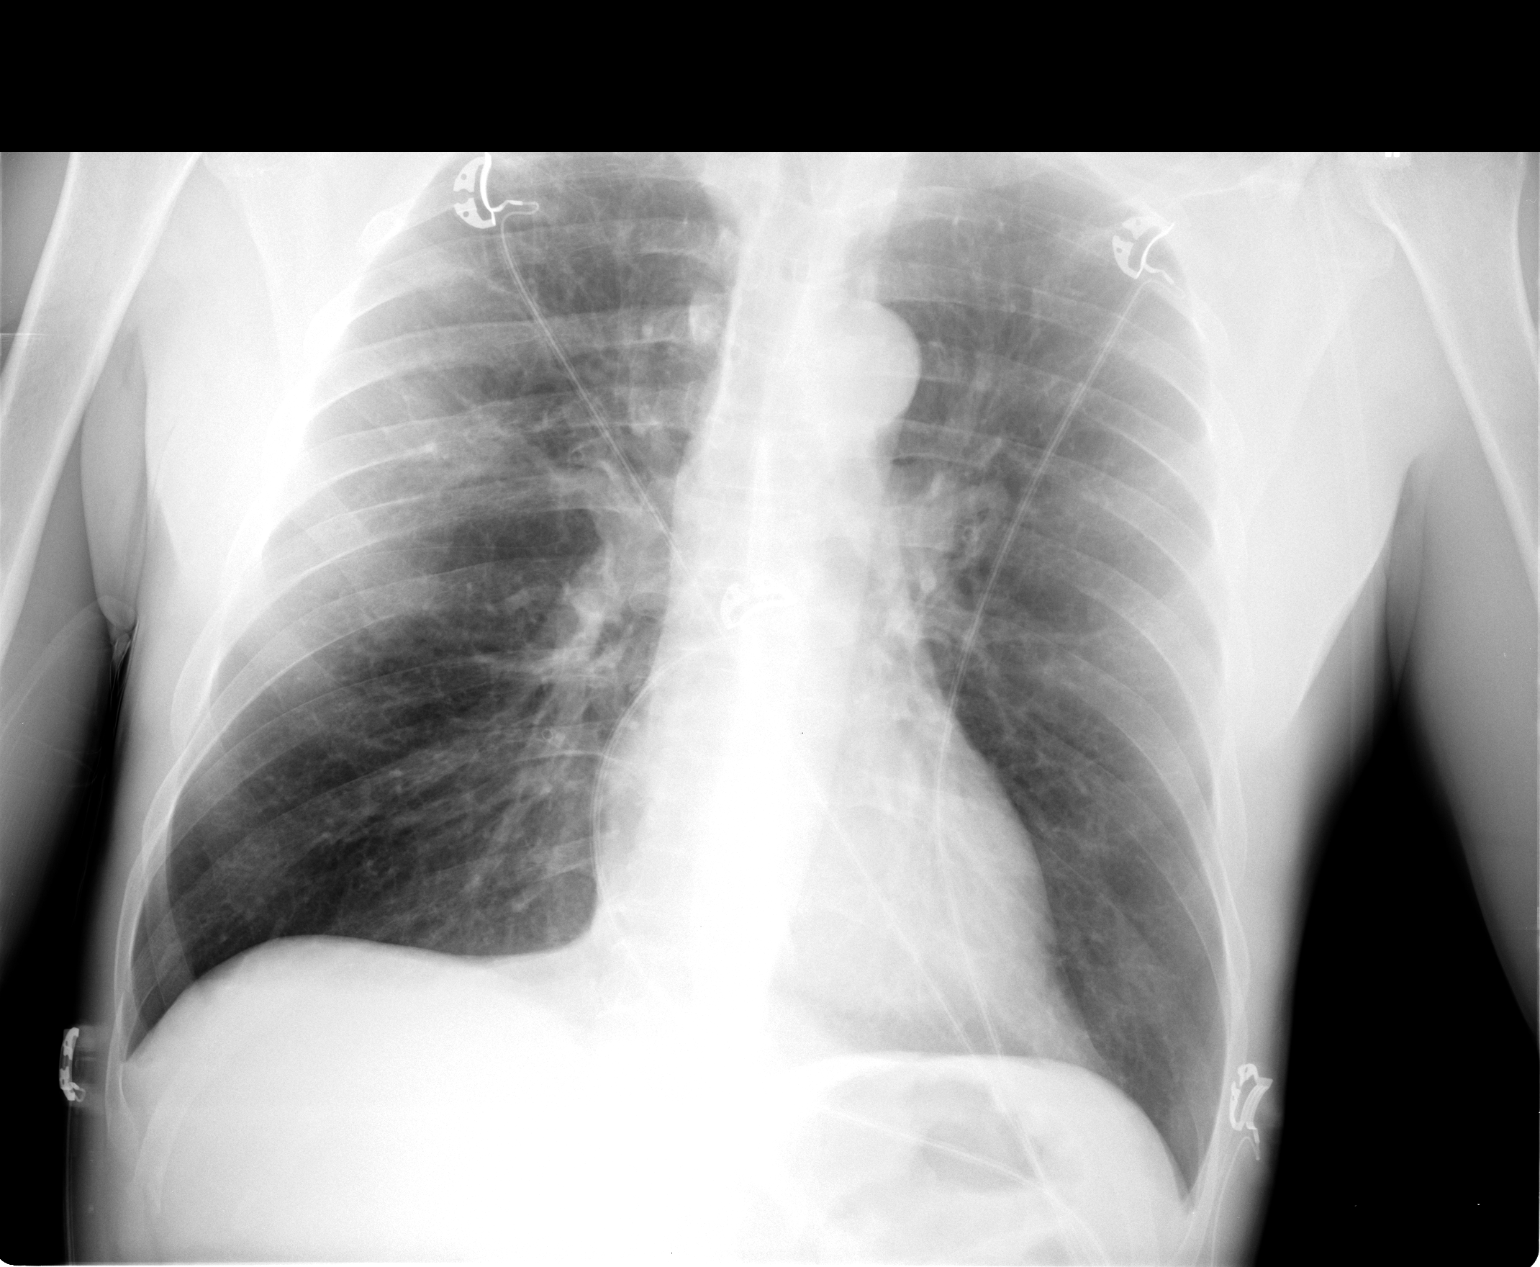

[1 of 1 positions shown; findings below may reference images not displayed]

FINDINGS: The lungs are well-aerated and clear.  There is no
evidence of focal opacification, pleural effusion or pneumothorax.

The cardiomediastinal silhouette is within normal limits.  No acute
osseous abnormalities are seen.
IMPRESSION: No acute cardiopulmonary process seen.

## 2014-03-29 ENCOUNTER — Emergency Department (HOSPITAL_COMMUNITY)
Admission: EM | Admit: 2014-03-29 | Discharge: 2014-03-29 | Disposition: A | Discharge status: Home / Self Care | Payer: Medicare HMO | Attending: Emergency Medicine | Admitting: Emergency Medicine

## 2014-03-29 ENCOUNTER — Emergency Department (HOSPITAL_COMMUNITY): Payer: Medicare HMO

## 2014-03-29 ENCOUNTER — Encounter (HOSPITAL_COMMUNITY): Payer: Self-pay

## 2014-03-29 DIAGNOSIS — F1410 Cocaine abuse, uncomplicated: Secondary | ICD-10-CM | POA: Diagnosis not present

## 2014-03-29 DIAGNOSIS — Z9889 Other specified postprocedural states: Secondary | ICD-10-CM | POA: Diagnosis not present

## 2014-03-29 DIAGNOSIS — F1210 Cannabis abuse, uncomplicated: Secondary | ICD-10-CM | POA: Diagnosis not present

## 2014-03-29 DIAGNOSIS — Z8719 Personal history of other diseases of the digestive system: Secondary | ICD-10-CM | POA: Diagnosis not present

## 2014-03-29 DIAGNOSIS — M791 Myalgia: Secondary | ICD-10-CM | POA: Diagnosis not present

## 2014-03-29 DIAGNOSIS — R200 Anesthesia of skin: Secondary | ICD-10-CM | POA: Diagnosis not present

## 2014-03-29 DIAGNOSIS — Z720 Tobacco use: Secondary | ICD-10-CM | POA: Insufficient documentation

## 2014-03-29 DIAGNOSIS — F1310 Sedative, hypnotic or anxiolytic abuse, uncomplicated: Secondary | ICD-10-CM | POA: Insufficient documentation

## 2014-03-29 DIAGNOSIS — J441 Chronic obstructive pulmonary disease with (acute) exacerbation: Secondary | ICD-10-CM | POA: Diagnosis not present

## 2014-03-29 DIAGNOSIS — Z7951 Long term (current) use of inhaled steroids: Secondary | ICD-10-CM | POA: Insufficient documentation

## 2014-03-29 DIAGNOSIS — R079 Chest pain, unspecified: Secondary | ICD-10-CM | POA: Diagnosis present

## 2014-03-29 DIAGNOSIS — Z79899 Other long term (current) drug therapy: Secondary | ICD-10-CM | POA: Diagnosis not present

## 2014-03-29 DIAGNOSIS — Z7952 Long term (current) use of systemic steroids: Secondary | ICD-10-CM | POA: Diagnosis not present

## 2014-03-29 LAB — PROTIME-INR
INR: 0.96 (ref 0.00–1.49)
Prothrombin Time: 12.9 seconds (ref 11.6–15.2)

## 2014-03-29 LAB — COMPREHENSIVE METABOLIC PANEL
ALT: 18 U/L (ref 0–53)
AST: 21 U/L (ref 0–37)
Albumin: 4.2 g/dL (ref 3.5–5.2)
Alkaline Phosphatase: 54 U/L (ref 39–117)
Anion gap: 4 — ABNORMAL LOW (ref 5–15)
BUN: 15 mg/dL (ref 6–23)
CO2: 25 mmol/L (ref 19–32)
Calcium: 9.2 mg/dL (ref 8.4–10.5)
Chloride: 107 mEq/L (ref 96–112)
Creatinine, Ser: 1.15 mg/dL (ref 0.50–1.35)
GFR calc Af Amer: 79 mL/min — ABNORMAL LOW (ref >90)
GFR calc non Af Amer: 68 mL/min — ABNORMAL LOW (ref >90)
Glucose, Bld: 101 mg/dL — ABNORMAL HIGH (ref 70–99)
Potassium: 3.6 mmol/L (ref 3.5–5.1)
Sodium: 136 mmol/L (ref 135–145)
Total Bilirubin: 0.4 mg/dL (ref 0.3–1.2)
Total Protein: 6.6 g/dL (ref 6.0–8.3)

## 2014-03-29 LAB — DIFFERENTIAL
Basophils Absolute: 0.1 10*3/uL (ref 0.0–0.1)
Basophils Relative: 1 % (ref 0–1)
Eosinophils Absolute: 0.3 10*3/uL (ref 0.0–0.7)
Eosinophils Relative: 7 % — ABNORMAL HIGH (ref 0–5)
Lymphocytes Relative: 47 % — ABNORMAL HIGH (ref 12–46)
Lymphs Abs: 2.3 10*3/uL (ref 0.7–4.0)
Monocytes Absolute: 0.5 10*3/uL (ref 0.1–1.0)
Monocytes Relative: 9 % (ref 3–12)
Neutro Abs: 1.8 10*3/uL (ref 1.7–7.7)
Neutrophils Relative %: 36 % — ABNORMAL LOW (ref 43–77)

## 2014-03-29 LAB — RAPID URINE DRUG SCREEN, HOSP PERFORMED
Amphetamines: NOT DETECTED
Barbiturates: NOT DETECTED
Benzodiazepines: NOT DETECTED
Cocaine: POSITIVE — AB
Opiates: NOT DETECTED
Tetrahydrocannabinol: POSITIVE — AB

## 2014-03-29 LAB — URINALYSIS, ROUTINE W REFLEX MICROSCOPIC
Bilirubin Urine: NEGATIVE
Glucose, UA: NEGATIVE mg/dL
Ketones, ur: NEGATIVE mg/dL
Leukocytes, UA: NEGATIVE
Nitrite: NEGATIVE
Protein, ur: NEGATIVE mg/dL
Specific Gravity, Urine: >1.03 — ABNORMAL HIGH (ref 1.005–1.030)
Urobilinogen, UA: 0.2 mg/dL (ref 0.0–1.0)
pH: 6 (ref 5.0–8.0)

## 2014-03-29 LAB — I-STAT CHEM 8, ED
BUN: 14 mg/dL (ref 6–23)
Calcium, Ion: 1.24 mmol/L — ABNORMAL HIGH (ref 1.12–1.23)
Chloride: 105 mEq/L (ref 96–112)
Creatinine, Ser: 1.1 mg/dL (ref 0.50–1.35)
Glucose, Bld: 100 mg/dL — ABNORMAL HIGH (ref 70–99)
HCT: 43 % (ref 39.0–52.0)
Hemoglobin: 14.6 g/dL (ref 13.0–17.0)
Potassium: 3.8 mmol/L (ref 3.5–5.1)
Sodium: 139 mmol/L (ref 135–145)
TCO2: 21 mmol/L (ref 0–100)

## 2014-03-29 LAB — CBC
HCT: 39.3 % (ref 39.0–52.0)
Hemoglobin: 13.4 g/dL (ref 13.0–17.0)
MCH: 31 pg (ref 26.0–34.0)
MCHC: 34.1 g/dL (ref 30.0–36.0)
MCV: 91 fL (ref 78.0–100.0)
Platelets: 239 10*3/uL (ref 150–400)
RBC: 4.32 MIL/uL (ref 4.22–5.81)
RDW: 13.9 % (ref 11.5–15.5)
WBC: 5 10*3/uL (ref 4.0–10.5)

## 2014-03-29 LAB — URINE MICROSCOPIC-ADD ON

## 2014-03-29 LAB — ETHANOL: Alcohol, Ethyl (B): <5 mg/dL (ref 0–9)

## 2014-03-29 LAB — APTT: aPTT: 28 seconds (ref 24–37)

## 2014-03-29 LAB — I-STAT TROPONIN, ED: Troponin i, poc: 0 ng/mL (ref 0.00–0.08)

## 2014-03-29 MED ORDER — ACETAMINOPHEN 500 MG PO TABS
1000.0000 mg | ORAL_TABLET | Freq: Once | ORAL | Status: AC
  Administered 2014-03-29: 1000 mg via ORAL
  Filled 2014-03-29: qty 2

## 2014-03-29 NOTE — Discharge Instructions (Signed)
[facility] Primary Care Doctor List    Sinda Du MD. Specialty: Pulmonary Disease Contact information: [place]  [place] [address]  [phone]   Tula Nakayama, MD. Specialty: [facility] Medicine Contact information: [address] [address]  [phone]   Sallee Lange, MD. Specialty: [facility] Medicine Contact information: [place]  [place] [address]  [phone]   Rosita Fire, MD Specialty: Internal Medicine Contact information: [place] [address]  [phone]   Delphina Cahill, MD. Specialty: Internal Medicine Contact information: [place] [address]  [phone]   Marjean Donna, MD. Specialty: Family Medicine Contact information: [place] [address]  [phone]   Leslie Andrea, MD. Specialty: Family Medicine Contact information: [place] [address]  [phone]   Asencion Noble, MD. Specialty: Internal Medicine Contact information: [address]  [place] [address]  [phone]    Your MRI was normal - you are not having a stroke - do not use cocaine.  Please call your doctor for a followup appointment within 24-48 hours. When you talk to your doctor please let them know that you were seen in the emergency department and have them acquire all of your records so that they can discuss the findings with you and formulate a treatment plan to fully care for your new and ongoing problems.

## 2014-03-29 NOTE — ED Provider Notes (Signed)
CSN: [id number]     Arrival date & time 03/29/14  1759 History  This chart was scribed for Johnna Acosta, MD by Rayfield Citizen, ED Scribe. This patient was seen in room APA04/APA04 and the patient's care was started at 6:04 PM.    Chief Complaint  Patient presents with  . extremity numbness   . Chest Pain   The history is provided by the patient. No language interpreter was used.     HPI Comments: [name] is a 59 y.o. male with past medical history of COPD who presents to the Emergency Department complaining of 1 week of intermittent arm numbness with right-sided facial numbness beginning today. Patient also reports chest pain and some SOB (no comment on time specifics).   Patient explains that he has had 1 week of occasional aches in his right arm that progress into numbness; these aches and numbness were absent yesterday and returned today (no comment on time of onset). He woke from a nap around 16:30 today with numbness in the right side of his face that has been constant since onset. He denies vision changes, weakness, balance issues.   LSN was 15:00, when patient went to take a nap.   Patient lives at home with his wife; his primary care is managed by [facility] center. He reports occasional cigarette, EtOH and marijuana use.  Past Medical History  Diagnosis Date  . S/P endoscopy July 21, 2010    Dr. Gala Romney: limited endoscopy, secondary to food impaction, Schatzki's ring seen  . Bronchitis   . COPD (chronic obstructive pulmonary disease)   . Asthma   . GERD (gastroesophageal reflux disease)    Past Surgical History  Procedure Laterality Date  . Lipoma removal from posterior neck     Family History  Problem Relation Age of Onset  . Bone cancer Mother     deceased in late 62s  . Aneurysm Father     deceased late 82s, brain  . Cancer Brother   . COPD Sister    History  Substance Use Topics  . Smoking status: Current Some Day Smoker -- 0.50 packs/day   Types: Cigarettes  . Smokeless tobacco: Former Systems developer    Quit date: 03/11/2010  . Alcohol Use: Yes     Comment: 6 pack a week sometime a couple of shots    Review of Systems  Musculoskeletal: Positive for myalgias (Right arm).  Neurological: Positive for numbness.  All other systems reviewed and are negative.   Allergies  Corn-containing products; Onion; Other; and Peanut-containing drug products  Home Medications   Prior to Admission medications   Medication Sig Start Date End Date Taking? Authorizing Provider  albuterol (PROVENTIL) (2.5 MG/3ML) 0.083% nebulizer solution Take 3 mLs (2.5 mg total) by nebulization every 6 (six) hours as needed. For shortness of breath and/or wheezing 12/21/11  Yes Nimish Luther Parody, MD  Cyanocobalamin (B-12 PO) Take 1 tablet by mouth daily.   Yes Historical Provider, MD  Fluticasone-Salmeterol (ADVAIR) 250-50 MCG/DOSE AEPB Inhale 1 puff into the lungs 2 (two) times daily. 12/21/11  Yes Nimish Luther Parody, MD  PROAIR HFA 108 (90 BASE) MCG/ACT inhaler Inhale 2 puffs into the lungs every 6 (six) hours as needed for wheezing or shortness of breath.  02/03/14  Yes Historical Provider, MD  predniSONE (DELTASONE) 20 MG tablet Take 2 tabs daily for 3 days,then 1 tab daily for 3 days,then 1/2 tab daily for 3 days,then STOP. Patient not taking: Reported on 03/29/2014  12/21/11   Nimish Luther Parody, MD  tiotropium (SPIRIVA HANDIHALER) 18 MCG inhalation capsule Place 1 capsule (18 mcg total) into inhaler and inhale daily. Patient not taking: Reported on 03/29/2014 12/21/11   Nimish Luther Parody, MD   BP 144/87 mmHg  Pulse 58  Temp(Src) 98.3 F (36.8 C) (Oral)  Resp 17  Ht 5\' 10"  (1.778 m)  Wt 148 lb (67.132 kg)  BMI 21.24 kg/m2  SpO2 99% Physical Exam  Constitutional: He appears well-developed and well-nourished.  HENT:  Head: Normocephalic and atraumatic.  Eyes: Conjunctivae are normal. Right eye exhibits no discharge. Left eye exhibits no discharge.  Cardiovascular:  Normal rate, regular rhythm and normal heart sounds.  Exam reveals no gallop and no friction rub.   No murmur heard. Pulmonary/Chest: Effort normal. No respiratory distress. He has no wheezes. He has no rales.  Abdominal: Soft. Bowel sounds are normal. There is no tenderness. There is no rebound and no guarding.  Neurological: He is alert. Coordination normal.  Unilateral numbness of the right face and neck   Speech is clear, cranial nerves III through XII are intact, memory is intact, strength is normal in all 4 extremities including grips, sensation is intact to light touch and pinprick in all 4 extremities. Coordination as tested by finger-nose-finger is normal, no limb ataxia. Normal gait, normal reflexes at the patellar tendons bilaterally  Skin: Skin is warm and dry. No rash noted. He is not diaphoretic. No erythema.  Psychiatric: He has a normal mood and affect.  Nursing note and vitals reviewed.   ED Course  Procedures   DIAGNOSTIC STUDIES: Oxygen Saturation is 100% on RA, normal by my interpretation.    COORDINATION OF CARE: 6:07 PM Discussed treatment plan with pt at bedside and pt agreed to plan.  610, neuro hospitalist paged, agreeable to consultation over the phone, agrees with not activating code stroke as the patient is not a thrombolytic candidate based on his symptoms, he does agree with stat MRI.  Labs Review Labs Reviewed  DIFFERENTIAL - Abnormal; Notable for the following:    Neutrophils Relative % 36 (*)    Lymphocytes Relative 47 (*)    Eosinophils Relative 7 (*)    All other components within normal limits  COMPREHENSIVE METABOLIC PANEL - Abnormal; Notable for the following:    Glucose, Bld 101 (*)    GFR calc non Af Amer 68 (*)    GFR calc Af Amer 79 (*)    Anion gap 4 (*)    All other components within normal limits  URINE RAPID DRUG SCREEN (HOSP PERFORMED) - Abnormal; Notable for the following:    Cocaine POSITIVE (*)    Tetrahydrocannabinol POSITIVE  (*)    All other components within normal limits  URINALYSIS, ROUTINE W REFLEX MICROSCOPIC - Abnormal; Notable for the following:    Specific Gravity, Urine >1.030 (*)    Hgb urine dipstick TRACE (*)    All other components within normal limits  URINE MICROSCOPIC-ADD ON - Abnormal; Notable for the following:    Squamous Epithelial / LPF MANY (*)    All other components within normal limits  I-STAT CHEM 8, ED - Abnormal; Notable for the following:    Glucose, Bld 100 (*)    Calcium, Ion 1.24 (*)    All other components within normal limits  ETHANOL  PROTIME-INR  APTT  CBC  I-STAT TROPOININ, ED  Randolm Idol, ED    Imaging Review Mr Brain Wo Contrast  03/29/2014  CLINICAL DATA:  Right facial numbness.  Weakness for 1 day duration.  EXAM: MRI HEAD WITHOUT CONTRAST  TECHNIQUE: Multiplanar, multiecho pulse sequences of the brain and surrounding structures were obtained without intravenous contrast.  COMPARISON:  None.  FINDINGS: The brain has a normal appearance on all pulse sequences without evidence of malformation, atrophy, old or acute infarction, mass lesion, hemorrhage, hydrocephalus or extra-axial collection. No pituitary mass. No fluid in the sinuses, middle ears or mastoids. No skull or skullbase lesion. There is flow in the major vessels at the base of the brain. Major venous sinuses show flow.  IMPRESSION: Normal MRI of the brain.   Electronically Signed   By: Nelson Chimes M.D.   On: 03/29/2014 18:49     EKG Interpretation   Date/Time:  Monday March 29 2014 18:11:31 EST Ventricular Rate:  57 PR Interval:  127 QRS Duration: 91 QT Interval:  439 QTC Calculation: 427 R Axis:   79 Text Interpretation:  Sinus rhythm Anteroseptal infarct, age indeterminate  Since last tracing rate slower Confirmed by Theodis Kinsel  MD, Emilyann Banka ([id number]) on  03/29/2014 6:14:04 PM      MDM   Final diagnoses:  Numbness    The patient has ongoing right-sided facial numbness, his urinalysis is  positive for cocaine which she now endorses using over the last week, his MRI is negative for stroke, the patient is stable for discharge, no exact etiology of the numbness is seen, no other concerning neurologic findings are found, neurology follow-up recommended, patient expresses understanding. Counseling given regarding substance abuse.   I personally performed the services described in this documentation, which was scribed in my presence. The recorded information has been reviewed and is accurate.      Johnna Acosta, MD 03/29/14 2153

## 2014-03-29 NOTE — ED Notes (Addendum)
Pt c/o chest pain , sob, and r arm and r side of face numb off and on x 1 week.  Dr. Sabra Heck at bedside.

## 2014-09-21 NOTE — Telephone Encounter (Signed)
Patient called in wanting to know why he was discharged from our practice, I explained to him why the discharge letter was sent back in 2012.    He thanked me and stated he was going to call his pcp to get a referral to another GI doctor.

## 2015-12-07 ENCOUNTER — Encounter (INDEPENDENT_AMBULATORY_CARE_PROVIDER_SITE_OTHER): Payer: Self-pay | Admitting: *Deleted

## 2015-12-07 ENCOUNTER — Other Ambulatory Visit (INDEPENDENT_AMBULATORY_CARE_PROVIDER_SITE_OTHER): Payer: Self-pay | Admitting: *Deleted

## 2015-12-07 DIAGNOSIS — Z1211 Encounter for screening for malignant neoplasm of colon: Secondary | ICD-10-CM

## 2016-01-27 ENCOUNTER — Telehealth (INDEPENDENT_AMBULATORY_CARE_PROVIDER_SITE_OTHER): Payer: Self-pay | Admitting: *Deleted

## 2016-01-27 ENCOUNTER — Encounter (INDEPENDENT_AMBULATORY_CARE_PROVIDER_SITE_OTHER): Payer: Self-pay | Admitting: *Deleted

## 2016-01-27 NOTE — Telephone Encounter (Signed)
Patient needs trilyte 

## 2016-01-30 MED ORDER — PEG 3350-KCL-NA BICARB-NACL 420 G PO SOLR: 4000.0000 mL | Freq: Once | ORAL | 0 refills | Status: AC

## 2016-02-20 ENCOUNTER — Telehealth (INDEPENDENT_AMBULATORY_CARE_PROVIDER_SITE_OTHER): Payer: Self-pay | Admitting: *Deleted

## 2016-02-20 NOTE — Telephone Encounter (Signed)
Referring MD/PCP: jenny baucom -- [facility]   Procedure: tcs  Reason/Indication:  screening  Has patient had this procedure before?  no  If so, when, by whom and where?    Is there a family history of colon cancer?  no  Who?  What age when diagnosed?    Is patient diabetic?   no      Does patient have prosthetic heart valve or mechanical valve?  no  Do you have a pacemaker?  no  Has patient ever had endocarditis? no  Has patient had joint replacement within last 12 months?  no  Does patient tend to be constipated or take laxatives? no  Does patient have a history of alcohol/drug use?  May drink 2-3 beers a week  Is patient on Coumadin, Plavix and/or Aspirin? no  Medications: advair 250/50 mg bid, ventolin prn, albuterol prn  Allergies: nkda  Medication Adjustment:   Procedure date & time: 03/22/16 at 830

## 2016-02-21 NOTE — Telephone Encounter (Signed)
agree

## 2016-03-22 ENCOUNTER — Encounter (HOSPITAL_COMMUNITY)
Admission: RE | Disposition: A | Discharge status: Home / Self Care | Payer: Self-pay | Source: Ambulatory Visit | Attending: Internal Medicine

## 2016-03-22 ENCOUNTER — Encounter (HOSPITAL_COMMUNITY): Payer: Self-pay | Admitting: *Deleted

## 2016-03-22 ENCOUNTER — Ambulatory Visit (HOSPITAL_COMMUNITY)
Admission: RE | Admit: 2016-03-22 | Discharge: 2016-03-22 | Disposition: A | Discharge status: Home / Self Care | Payer: Medicare HMO | Source: Ambulatory Visit | Attending: Internal Medicine | Admitting: Internal Medicine

## 2016-03-22 DIAGNOSIS — D125 Benign neoplasm of sigmoid colon: Secondary | ICD-10-CM | POA: Insufficient documentation

## 2016-03-22 DIAGNOSIS — Z1211 Encounter for screening for malignant neoplasm of colon: Secondary | ICD-10-CM | POA: Diagnosis not present

## 2016-03-22 DIAGNOSIS — K5730 Diverticulosis of large intestine without perforation or abscess without bleeding: Secondary | ICD-10-CM | POA: Insufficient documentation

## 2016-03-22 DIAGNOSIS — D120 Benign neoplasm of cecum: Secondary | ICD-10-CM | POA: Diagnosis not present

## 2016-03-22 DIAGNOSIS — J449 Chronic obstructive pulmonary disease, unspecified: Secondary | ICD-10-CM | POA: Insufficient documentation

## 2016-03-22 DIAGNOSIS — D123 Benign neoplasm of transverse colon: Secondary | ICD-10-CM | POA: Diagnosis not present

## 2016-03-22 DIAGNOSIS — Z79899 Other long term (current) drug therapy: Secondary | ICD-10-CM | POA: Insufficient documentation

## 2016-03-22 DIAGNOSIS — F17210 Nicotine dependence, cigarettes, uncomplicated: Secondary | ICD-10-CM | POA: Insufficient documentation

## 2016-03-22 HISTORY — PX: COLONOSCOPY: SHX5424

## 2016-03-22 SURGERY — COLONOSCOPY
Anesthesia: Moderate Sedation

## 2016-03-22 MED ORDER — SODIUM CHLORIDE 0.9 % IV SOLN
INTRAVENOUS | Status: DC
  Administered 2016-03-22: 08:00:00 via INTRAVENOUS

## 2016-03-22 MED ORDER — STERILE WATER FOR IRRIGATION IR SOLN
Status: DC | PRN
  Administered 2016-03-22: 100 mL

## 2016-03-22 MED ORDER — MIDAZOLAM HCL 5 MG/5ML IJ SOLN
INTRAMUSCULAR | Status: DC | PRN
  Administered 2016-03-22: 2 mg via INTRAVENOUS
  Administered 2016-03-22 (×4): 1 mg via INTRAVENOUS
  Administered 2016-03-22 (×2): 2 mg via INTRAVENOUS

## 2016-03-22 MED ORDER — MIDAZOLAM HCL 5 MG/5ML IJ SOLN
INTRAMUSCULAR | Status: AC
  Filled 2016-03-22: qty 5

## 2016-03-22 MED ORDER — MEPERIDINE HCL 50 MG/ML IJ SOLN
INTRAMUSCULAR | Status: DC
Start: 2016-03-22 — End: 2016-03-22
  Filled 2016-03-22: qty 1

## 2016-03-22 MED ORDER — MEPERIDINE HCL 50 MG/ML IJ SOLN
INTRAMUSCULAR | Status: DC | PRN
  Administered 2016-03-22 (×2): 25 mg via INTRAVENOUS

## 2016-03-22 MED ORDER — MIDAZOLAM HCL 5 MG/5ML IJ SOLN
INTRAMUSCULAR | Status: AC
  Filled 2016-03-22: qty 10

## 2016-03-22 NOTE — H&P (Signed)
[name] is an 60 y.o. male.   Chief Complaint: Patient is here for colonoscopy. HPI: Patient is 60 year old African-American male who is here for screening colonoscopy. This is patient's first exam. He denies abdominal pain change in bowel habits or rectal bleeding. Family history is negative for CRC.  Past Medical History:  Diagnosis Date  . Asthma   . Bronchitis   . COPD (chronic obstructive pulmonary disease) ([place])   . GERD (gastroesophageal reflux disease)   . S/P endoscopy July 21, 2010   Dr. Gala Romney: limited endoscopy, secondary to food impaction, Schatzki's ring seen    Past Surgical History:  Procedure Laterality Date  . lipoma removal from posterior neck      Family History  Problem Relation Age of Onset  . Bone cancer Mother     deceased in late 75s  . Aneurysm Father     deceased late 13s, brain  . Cancer Brother   . COPD Sister   . Colon cancer Neg Hx    Social History:  reports that he has been smoking Cigarettes.  He has been smoking about 0.50 packs per day. He quit smokeless tobacco use about 6 years ago. He reports that he drinks alcohol. He reports that he uses drugs, including Marijuana, about 2 times per week.  Allergies:  Allergies  Allergen Reactions  . Corn-Containing Products Itching  . Onion Itching  . Other     Squash and Tomatoes cause itching and whelps.   . Peanut-Containing Drug Products Other (See Comments)    Itching and Whelps    Medications Prior to Admission  Medication Sig Dispense Refill  . albuterol (PROVENTIL) (2.5 MG/3ML) 0.083% nebulizer solution Take 3 mLs (2.5 mg total) by nebulization every 6 (six) hours as needed. For shortness of breath and/or wheezing 75 mL 0  . albuterol (VENTOLIN HFA) 108 (90 Base) MCG/ACT inhaler Inhale 1 puff into the lungs every 6 (six) hours as needed for wheezing or shortness of breath.    . Fluticasone-Salmeterol (ADVAIR) 250-50 MCG/DOSE AEPB Inhale 1 puff into the lungs 2 (two) times daily. 60  each 0  . ibuprofen (ADVIL,MOTRIN) 200 MG tablet Take 200 mg by mouth every 6 (six) hours as needed for moderate pain.      No results found for this or any previous visit (from the past 48 hour(s)). No results found.  ROS  Blood pressure (!) 146/86, pulse (!) 55, temperature 98.4 F (36.9 C), temperature source Oral, resp. rate (!) 22, height 5\' 10"  (1.778 m), weight 140 lb (63.5 kg), SpO2 98 %. Physical Exam  Constitutional:  Well-developed thin African-American male in NAD.  HENT:  Mouth/Throat: Oropharynx is clear and moist.  Eyes: Conjunctivae are normal. No scleral icterus.  Neck: No thyromegaly present.  Cardiovascular: Normal rate, regular rhythm and normal heart sounds.   No murmur heard. Respiratory: Effort normal and breath sounds normal.  GI: Soft. He exhibits no distension and no mass. There is no tenderness.  Musculoskeletal: He exhibits no edema.  Lymphadenopathy:    He has no cervical adenopathy.  Neurological: He is alert.  Skin: Skin is warm and dry.     Assessment/Plan Average risk screening colonoscopy.  Hildred Laser, MD 03/22/2016, 8:20 AM

## 2016-03-22 NOTE — Op Note (Signed)
[facility] Patient Name: [name] Procedure Date: 03/22/2016 8:12 AM MRN: [id number] Date of Birth: Dec 30, 1955 Attending MD: Hildred Laser , MD CSN: [id number] Age: 60 Admit Type: Outpatient Procedure:                Colonoscopy Indications:              Screening for colorectal malignant neoplasm Providers:                Hildred Laser, MD, Rosina Lowenstein, RN, Aram Candela Referring MD:             Antionette Fairy, PA-C Medicines:                Meperidine 50 mg IV, Midazolam 10 mg IV Complications:            No immediate complications. Estimated Blood Loss:     Estimated blood loss was minimal. Procedure:                Pre-Anesthesia Assessment:                           - Prior to the procedure, a History and Physical                            was performed, and patient medications and                            allergies were reviewed. The patient's tolerance of                            previous anesthesia was also reviewed. The risks                            and benefits of the procedure and the sedation                            options and risks were discussed with the patient.                            All questions were answered, and informed consent                            was obtained. Prior Anticoagulants: The patient                            last took ibuprofen 5 days prior to the procedure.                            ASA Grade Assessment: II - A patient with mild                            systemic disease. After reviewing the risks and                            benefits, the patient was deemed in satisfactory  condition to undergo the procedure.                           After obtaining informed consent, the colonoscope                            was passed under direct vision. Throughout the                            procedure, the patient's blood pressure, pulse, and                            oxygen saturations were  monitored continuously. The                            was introduced through the anus and advanced to the                            the cecum, identified by appendiceal orifice and                            ileocecal valve. The colonoscopy was technically                            difficult and complex due to a tortuous colon.                            Successful completion of the procedure was aided by                            changing the patient to a supine position and using                            manual pressure. The patient tolerated the                            procedure well. The quality of the bowel                            preparation was adequate to identify polyps. The                            ileocecal valve, appendiceal orifice, and rectum                            were photographed. Scope In: 8:30:36 AM Scope Out: 9:21:56 AM Scope Withdrawal Time: 0 hours 35 minutes 19 seconds  Total Procedure Duration: 0 hours 51 minutes 20 seconds  Findings:      The perianal and digital rectal examinations were normal.      A 7 mm polyp was found in the distal transverse colon. The polyp was       semi-sessile. The polyp was removed with a hot snare. Resection and       retrieval were complete. The pathology  specimen was placed into Bottle       Number 1.      Two sessile polyps were found in the transverse colon and cecum. The       polyps were 4 to 6 mm in size. These polyps were removed with a cold       snare. Resection and retrieval were complete. The pathology specimen was       placed into Bottle Number 2.      Three semi-pedunculated polyps were found in the mid sigmoid colon. The       polyps were 8 to 10 mm in size. These polyps were removed with a hot       snare. Resection and retrieval were complete. The pathology specimen was       placed into Bottle Number 3.      Three sessile polyps were found in the sigmoid colon. The polyps were       small in  size. These polyps were removed with a cold snare. Resection       and retrieval were complete. The pathology specimen was placed into       Bottle Number 3.      A few small-mouthed diverticula were found in the sigmoid colon.      The retroflexed view of the distal rectum and anal verge was normal and       showed no anal or rectal abnormalities. Impression:               - One 7 mm polyp in the distal transverse colon,                            removed with a hot snare. Resected and retrieved.                           - Two 4 to 6 mm polyps in the transverse colon and                            in the cecum, removed with a cold snare. Resected                            and retrieved.                           - Three 8 to 10 mm polyps in the mid sigmoid colon,                            removed with a hot snare. Resected and retrieved.                           - Three small polyps in the sigmoid colon, removed                            with a cold snare. Resected and retrieved.                           - Diverticulosis in the sigmoid colon.                           -  The distal rectum and anal verge are normal on                            retroflexion view.                           comment: patient had 9 polyps altogether. Moderate Sedation:      Moderate (conscious) sedation was administered by the endoscopy nurse       and supervised by the endoscopist. The following parameters were       monitored: oxygen saturation, heart rate, blood pressure, CO2       capnography and response to care. Total physician intraservice time was       57 minutes. Recommendation:           - Patient has a contact number available for                            emergencies. The signs and symptoms of potential                            delayed complications were discussed with the                            patient. Return to normal activities tomorrow.                            Written  discharge instructions were provided to the                            patient.                           - High fiber diet today.                           - Continue present medications.                           - No aspirin, ibuprofen, naproxen, or other                            non-steroidal anti-inflammatory drugs for 7 days                            after polyp removal.                           - Await pathology results.                           - Repeat colonoscopy in 3 years for surveillance. Procedure Code(s):        --- Professional ---                           [phone], Colonoscopy, flexible; with removal of  tumor(s), polyp(s), or other lesion(s) by snare                            technique                           99152, Moderate sedation services provided by the                            same physician or other qualified health care                            professional performing the diagnostic or                            therapeutic service that the sedation supports,                            requiring the presence of an independent trained                            observer to assist in the monitoring of the                            patient's level of consciousness and physiological                            status; initial 15 minutes of intraservice time,                            patient age 67 years or older                           [phone], Moderate sedation services; each additional                            15 minutes intraservice time                           99153, Moderate sedation services; each additional                            15 minutes intraservice time                           99153, Moderate sedation services; each additional                            15 minutes intraservice time Diagnosis Code(s):        --- Professional ---                           Z12.11, Encounter for screening for malignant                             neoplasm of colon  D12.3, Benign neoplasm of transverse colon (hepatic                            flexure or splenic flexure)                           D12.0, Benign neoplasm of cecum                           D12.5, Benign neoplasm of sigmoid colon                           K57.30, Diverticulosis of large intestine without                            perforation or abscess without bleeding CPT copyright 2016 American Medical Association. All rights reserved. The codes documented in this report are preliminary and upon coder review may  be revised to meet current compliance requirements. Hildred Laser, MD Hildred Laser, MD 03/22/2016 9:33:40 AM This report has been signed electronically. Number of Addenda: 0

## 2016-03-22 NOTE — Discharge Instructions (Signed)
No aspirin or NSAIDs for 1 week. No Advil, Aleve, Motrin.   Resume usual medications and high fiber diet. No driving for 24 hours. Physician will call with biopsy results.  Colonoscopy, Adult, Care After This sheet gives you information about how to care for yourself after your procedure. Your health care provider may also give you more specific instructions. If you have problems or questions, contact your health care provider. What can I expect after the procedure? After the procedure, it is common to have:  A small amount of blood in your stool for 24 hours after the procedure.  Some gas.  Mild abdominal cramping or bloating. Follow these instructions at home: General instructions  For the first 24 hours after the procedure:  Do not drive or use machinery.  Do not sign important documents.  Do not drink alcohol.  Do your regular daily activities at a slower pace than normal.  Eat soft, easy-to-digest foods.  Rest often.  Take over-the-counter or prescription medicines only as told by your health care provider.  It is up to you to get the results of your procedure. Ask your health care provider, or the department performing the procedure, when your results will be ready. Relieving cramping and bloating  Try walking around when you have cramps or feel bloated. Eating and drinking  Drink enough fluid to keep your urine clear or pale yellow.  Resume your normal diet as instructed by your health care provider. Avoid heavy or fried foods that are hard to digest.  Avoid drinking alcohol for as [place] as instructed by your health care provider. Contact a health care provider if:  You have blood in your stool 2-3 days after the procedure. Get help right away if:  You have more than a small spotting of blood in your stool.  You pass large blood clots in your stool.  Your abdomen is swollen.  You have nausea or vomiting.  You have a fever.  You have increasing  abdominal pain that is not relieved with medicine. This information is not intended to replace advice given to you by your health care provider. Make sure you discuss any questions you have with your health care provider. Document Released: 10/25/2003 Document Revised: 12/05/2015 Document Reviewed: 05/24/2015 Elsevier Interactive Patient Education  2017 [employer].

## 2016-03-27 ENCOUNTER — Encounter (HOSPITAL_COMMUNITY): Payer: Self-pay | Admitting: Internal Medicine

## 2016-09-01 ENCOUNTER — Encounter (HOSPITAL_COMMUNITY): Payer: Self-pay | Admitting: Emergency Medicine

## 2016-09-01 ENCOUNTER — Emergency Department (HOSPITAL_COMMUNITY): Payer: Medicare HMO

## 2016-09-01 ENCOUNTER — Emergency Department (HOSPITAL_COMMUNITY)
Admission: EM | Admit: 2016-09-01 | Discharge: 2016-09-01 | Disposition: A | Discharge status: Home / Self Care | Payer: Medicare HMO | Attending: Emergency Medicine | Admitting: Emergency Medicine

## 2016-09-01 DIAGNOSIS — J441 Chronic obstructive pulmonary disease with (acute) exacerbation: Secondary | ICD-10-CM | POA: Diagnosis not present

## 2016-09-01 DIAGNOSIS — F17210 Nicotine dependence, cigarettes, uncomplicated: Secondary | ICD-10-CM | POA: Diagnosis not present

## 2016-09-01 DIAGNOSIS — J45909 Unspecified asthma, uncomplicated: Secondary | ICD-10-CM | POA: Insufficient documentation

## 2016-09-01 DIAGNOSIS — Z79899 Other long term (current) drug therapy: Secondary | ICD-10-CM | POA: Diagnosis not present

## 2016-09-01 DIAGNOSIS — R0602 Shortness of breath: Secondary | ICD-10-CM | POA: Diagnosis present

## 2016-09-01 LAB — BASIC METABOLIC PANEL WITH GFR
Anion gap: 10 (ref 5–15)
BUN: 14 mg/dL (ref 6–20)
CO2: 25 mmol/L (ref 22–32)
Calcium: 9.6 mg/dL (ref 8.9–10.3)
Chloride: 99 mmol/L — ABNORMAL LOW (ref 101–111)
Creatinine, Ser: 1.18 mg/dL (ref 0.61–1.24)
GFR calc Af Amer: >60 mL/min
GFR calc non Af Amer: >60 mL/min
Glucose, Bld: 137 mg/dL — ABNORMAL HIGH (ref 65–99)
Potassium: 3.5 mmol/L (ref 3.5–5.1)
Sodium: 134 mmol/L — ABNORMAL LOW (ref 135–145)

## 2016-09-01 LAB — CBC WITH DIFFERENTIAL/PLATELET
Basophils Absolute: 0 10*3/uL (ref 0.0–0.1)
Basophils Relative: 1 %
Eosinophils Absolute: 0.1 10*3/uL (ref 0.0–0.7)
Eosinophils Relative: 1 %
HCT: 41.9 % (ref 39.0–52.0)
Hemoglobin: 14.5 g/dL (ref 13.0–17.0)
Lymphocytes Relative: 22 %
Lymphs Abs: 0.8 10*3/uL (ref 0.7–4.0)
MCH: 31.4 pg (ref 26.0–34.0)
MCHC: 34.6 g/dL (ref 30.0–36.0)
MCV: 90.7 fL (ref 78.0–100.0)
Monocytes Absolute: 0.5 10*3/uL (ref 0.1–1.0)
Monocytes Relative: 13 %
Neutro Abs: 2.3 10*3/uL (ref 1.7–7.7)
Neutrophils Relative %: 63 %
Platelets: 147 10*3/uL — ABNORMAL LOW (ref 150–400)
RBC: 4.62 MIL/uL (ref 4.22–5.81)
RDW: 13.9 % (ref 11.5–15.5)
WBC: 3.7 10*3/uL — ABNORMAL LOW (ref 4.0–10.5)

## 2016-09-01 MED ORDER — ALBUTEROL (5 MG/ML) CONTINUOUS INHALATION SOLN
10.0000 mg/h | INHALATION_SOLUTION | Freq: Once | RESPIRATORY_TRACT | Status: AC
  Administered 2016-09-01: 10 mg/h via RESPIRATORY_TRACT
  Filled 2016-09-01: qty 20

## 2016-09-01 MED ORDER — IPRATROPIUM BROMIDE 0.02 % IN SOLN
1.0000 mg | Freq: Once | RESPIRATORY_TRACT | Status: AC
  Administered 2016-09-01: 1 mg via RESPIRATORY_TRACT
  Filled 2016-09-01: qty 5

## 2016-09-01 MED ORDER — PREDNISONE 20 MG PO TABS: 40.0000 mg | ORAL_TABLET | Freq: Every day | ORAL | 0 refills | Status: DC

## 2016-09-01 MED ORDER — AZITHROMYCIN 250 MG PO TABS: ORAL_TABLET | ORAL | 0 refills | Status: DC

## 2016-09-01 MED ORDER — METHYLPREDNISOLONE SODIUM SUCC 125 MG IJ SOLR
125.0000 mg | Freq: Once | INTRAMUSCULAR | Status: AC
  Administered 2016-09-01: 125 mg via INTRAVENOUS
  Filled 2016-09-01: qty 2

## 2016-09-01 NOTE — ED Notes (Signed)
Pt O2 stayed between %93-%95 while ambulating. Pt did not complain of shortness of breath.

## 2016-09-01 NOTE — ED Notes (Signed)
Pt made aware to return if symptoms worsen or if any life threatening symptoms occur.   

## 2016-09-01 NOTE — Discharge Instructions (Signed)
Take the prescriptions as directed.  Use your albuterol inhaler (2 to 4 puffs) or your albuterol nebulizer (1 unit dose) every 4 hours for the next 7 days, then as needed for cough, wheezing, or shortness of breath.  Call your regular medical doctor Monday morning to schedule a follow up appointment within the next 2 to 3 days.  Return to the Emergency Department immediately sooner if worsening.

## 2016-09-01 NOTE — ED Triage Notes (Signed)
Pt reports he was cutting grass Wednesday, since has felt [facility] with cough. Pt also reports chills/sweating

## 2016-09-01 NOTE — ED Provider Notes (Signed)
[place] DEPT Provider Note   CSN: [id number] Arrival date & time: 09/01/16  1345     History   Chief Complaint Chief Complaint  Patient presents with  . Shortness of Breath    HPI [name] is a 61 y.o. male.  HPI  Pt was seen at 1350.  Per pt, c/o gradual onset and worsening of persistent cough, wheezing and SOB for the past 2 days.  Describes his symptoms as "my asthma is acting up after cutting the grass."  Cough has been productive of "white" sputum. Has been using home MDI and nebs with transient relief.  Denies CP/palpitations, no back pain, no abd pain, no N/V/D, no fevers, no rash.    Past Medical History:  Diagnosis Date  . Asthma   . Bronchitis   . COPD (chronic obstructive pulmonary disease) ([place])   . GERD (gastroesophageal reflux disease)   . S/P endoscopy July 21, 2010   Dr. Gala Romney: limited endoscopy, secondary to food impaction, Schatzki's ring seen    Patient Active Problem List   Diagnosis Date Noted  . Special screening for malignant neoplasms, colon 12/07/2015  . COPD with acute exacerbation ([place]) 12/19/2011  . Acute respiratory failure ([place]) 02/20/2011  . COPD (chronic obstructive pulmonary disease) ([place]) 02/20/2011  . Anemia 02/20/2011  . Screening for colorectal cancer 08/08/2010  . Dysphagia 08/08/2010    Past Surgical History:  Procedure Laterality Date  . COLONOSCOPY N/A 03/22/2016   Procedure: COLONOSCOPY;  Surgeon: Rogene Houston, MD;  Location: [facility];  Service: Endoscopy;  Laterality: N/A;  830  . lipoma removal from posterior neck         Home Medications    Prior to Admission medications   Medication Sig Start Date End Date Taking? Authorizing Provider  albuterol (PROVENTIL) (2.5 MG/3ML) 0.083% nebulizer solution Take 3 mLs (2.5 mg total) by nebulization every 6 (six) hours as needed. For shortness of breath and/or wheezing 12/21/11   Doree Albee, MD  albuterol (VENTOLIN HFA) 108 (90 Base) MCG/ACT inhaler  Inhale 1 puff into the lungs every 6 (six) hours as needed for wheezing or shortness of breath.    [provider]  Fluticasone-Salmeterol (ADVAIR) 250-50 MCG/DOSE AEPB Inhale 1 puff into the lungs 2 (two) times daily. 12/21/11   Doree Albee, MD  ibuprofen (ADVIL,MOTRIN) 200 MG tablet Take 1 tablet (200 mg total) by mouth every 6 (six) hours as needed for moderate pain. 03/29/16   Rogene Houston, MD    Family History Family History  Problem Relation Age of Onset  . Bone cancer Mother        deceased in late 75s  . Aneurysm Father        deceased late 57s, brain  . Cancer Brother   . COPD Sister   . Colon cancer Neg Hx     Social History Social History  Substance Use Topics  . Smoking status: Current Some Day Smoker    Packs/day: 0.50    Types: Cigarettes  . Smokeless tobacco: Former Systems developer    Quit date: 03/11/2010  . Alcohol use Yes     Comment: 6 pack a week sometime a couple of shots     Allergies   Corn-containing products; Onion; Other; and Peanut-containing drug products   Review of Systems Review of Systems ROS: Statement: All systems negative except as marked or noted in the HPI; Constitutional: Negative for fever and chills. ; ; Eyes: Negative for eye pain, redness  and discharge. ; ; ENMT: Negative for ear pain, hoarseness, nasal congestion, sinus pressure and sore throat. ; ; Cardiovascular: Negative for chest pain, palpitations, diaphoresis, and peripheral edema. ; ; Respiratory: +cough, wheezing, SOB. Negative for stridor. ; ; Gastrointestinal: Negative for nausea, vomiting, diarrhea, abdominal pain, blood in stool, hematemesis, jaundice and rectal bleeding. . ; ; Genitourinary: Negative for dysuria, flank pain and hematuria. ; ; Musculoskeletal: Negative for back pain and neck pain. Negative for swelling and trauma.; ; Skin: Negative for pruritus, rash, abrasions, blisters, bruising and skin lesion.; ; Neuro: Negative for headache, lightheadedness and neck  stiffness. Negative for weakness, altered level of consciousness, altered mental status, extremity weakness, paresthesias, involuntary movement, seizure and syncope.       Physical Exam Updated Vital Signs There were no vitals taken for this visit.  Physical Exam 1355: Physical examination:  Nursing notes reviewed; Vital signs and O2 SAT reviewed;  Constitutional: Well developed, Well nourished, Well hydrated, Uncomfortable appearing.;; Head:  Normocephalic, atraumatic; Eyes: EOMI, PERRL, No scleral icterus; ENMT: Mouth and pharynx normal, Mucous membranes moist; Neck: Supple, Full range of motion, No lymphadenopathy; Cardiovascular: Tachycardic rate and rhythm, No gallop; Respiratory: Breath sounds coarse & equal bilaterally, insp/exp wheezes bilat. No wheezing. Frequent coughing with expectoration of white sputum. Speaking short sentences, sitting upright, tachypneic.; Chest: Nontender, Movement normal; Abdomen: Soft, Nontender, Nondistended, Normal bowel sounds; Genitourinary: No CVA tenderness; Extremities: Pulses normal, No tenderness, No edema, No calf edema or asymmetry.; Neuro: AA&Ox3, Major CN grossly intact.  Speech clear. No gross focal motor or sensory deficits in extremities.; Skin: Color normal, Warm, Dry.   ED Treatments / Results  Labs (all labs ordered are listed, but only abnormal results are displayed)   EKG  EKG Interpretation None       Radiology   Procedures Procedures (including critical care time)  Medications Ordered in ED Medications  methylPREDNISolone sodium succinate (SOLU-MEDROL) 125 mg/2 mL injection 125 mg (not administered)  albuterol (PROVENTIL,VENTOLIN) solution continuous neb (not administered)  ipratropium (ATROVENT) nebulizer solution 1 mg (not administered)     Initial Impression / Assessment and Plan / ED Course  I have reviewed the triage vital signs and the nursing notes.  Pertinent labs & imaging results that were available during  my care of the patient were reviewed by me and considered in my medical decision making (see chart for details).  MDM Reviewed: previous chart, vitals and nursing note Reviewed previous: labs Interpretation: labs and x-ray    Results for orders placed or performed during the hospital encounter of 41/96/22  Basic metabolic panel  Result Value Ref Range   Sodium 134 (L) 135 - 145 mmol/L   Potassium 3.5 3.5 - 5.1 mmol/L   Chloride 99 (L) 101 - 111 mmol/L   CO2 25 22 - 32 mmol/L   Glucose, Bld 137 (H) 65 - 99 mg/dL   BUN 14 6 - 20 mg/dL   Creatinine, Ser 1.18 0.61 - 1.24 mg/dL   Calcium 9.6 8.9 - 10.3 mg/dL   GFR calc non Af Amer >60 >60 mL/min   GFR calc Af Amer >60 >60 mL/min   Anion gap 10 5 - 15  CBC with Differential  Result Value Ref Range   WBC 3.7 (L) 4.0 - 10.5 K/uL   RBC 4.62 4.22 - 5.81 MIL/uL   Hemoglobin 14.5 13.0 - 17.0 g/dL   HCT 41.9 39.0 - 52.0 %   MCV 90.7 78.0 - 100.0 fL   MCH 31.4 26.0 -  34.0 pg   MCHC 34.6 30.0 - 36.0 g/dL   RDW 13.9 11.5 - 15.5 %   Platelets 147 (L) 150 - 400 K/uL   Neutrophils Relative % 63 %   Neutro Abs 2.3 1.7 - 7.7 K/uL   Lymphocytes Relative 22 %   Lymphs Abs 0.8 0.7 - 4.0 K/uL   Monocytes Relative 13 %   Monocytes Absolute 0.5 0.1 - 1.0 K/uL   Eosinophils Relative 1 %   Eosinophils Absolute 0.1 0.0 - 0.7 K/uL   Basophils Relative 1 %   Basophils Absolute 0.0 0.0 - 0.1 K/uL   Dg Chest 2 View Result Date: 09/01/2016 CLINICAL DATA:  Short of breath for 4 days EXAM: CHEST  2 VIEW COMPARISON:  12/19/2011 FINDINGS: Normal heart size. There is slight mass effect upon the trachea on the right in the thoracic inlet. This is a stable finding. Lungs clear. No pneumothorax. No pleural effusion. IMPRESSION: No active cardiopulmonary disease. 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 Thyroid enlargement is not excluded. Electronically Signed   By: Marybelle Killings M.D.   On: 09/01/2016 15:22    1545:  Pt states he "feels better"  after neb and steroid.  NAD, lungs CTA bilat, no wheezing, resps easy, speaking full sentences, Sats 100% R/A.  Pt ambulated around the ED with Sats remaining 95 % R/A, resps easy, NAD.  Has tol PO well while in the ED without N/V. Pt states he wants to go home now. Tx symptomatically, f/u PMD. States he "has enough" MDI and nebs at home and does not need a rx for either.  Dx and testing d/w pt and family.  Questions answered.  Verb understanding, agreeable to d/c home with outpt f/u.     Final Clinical Impressions(s) / ED Diagnoses   Final diagnoses:  None    New Prescriptions New Prescriptions   No medications on file     Francine Graven, DO 09/05/16 2023

## 2017-12-30 ENCOUNTER — Other Ambulatory Visit (HOSPITAL_COMMUNITY): Payer: Self-pay | Admitting: Internal Medicine

## 2017-12-30 DIAGNOSIS — Z87891 Personal history of nicotine dependence: Secondary | ICD-10-CM

## 2018-01-06 ENCOUNTER — Ambulatory Visit (HOSPITAL_COMMUNITY)
Admission: RE | Admit: 2018-01-06 | Discharge: 2018-01-06 | Disposition: A | Discharge status: Home / Self Care | Payer: Medicare Other | Source: Ambulatory Visit | Attending: Internal Medicine | Admitting: Internal Medicine

## 2018-01-06 DIAGNOSIS — Z87891 Personal history of nicotine dependence: Secondary | ICD-10-CM | POA: Diagnosis not present

## 2018-01-06 DIAGNOSIS — Z136 Encounter for screening for cardiovascular disorders: Secondary | ICD-10-CM | POA: Diagnosis not present

## 2018-03-20 ENCOUNTER — Ambulatory Visit (INDEPENDENT_AMBULATORY_CARE_PROVIDER_SITE_OTHER): Payer: Self-pay | Admitting: Otolaryngology

## 2018-03-20 ENCOUNTER — Ambulatory Visit (INDEPENDENT_AMBULATORY_CARE_PROVIDER_SITE_OTHER): Payer: Medicare Other | Admitting: Otolaryngology

## 2018-03-20 DIAGNOSIS — H6121 Impacted cerumen, right ear: Secondary | ICD-10-CM

## 2018-03-20 DIAGNOSIS — K219 Gastro-esophageal reflux disease without esophagitis: Secondary | ICD-10-CM

## 2018-03-20 DIAGNOSIS — R490 Dysphonia: Secondary | ICD-10-CM

## 2018-04-28 ENCOUNTER — Ambulatory Visit (INDEPENDENT_AMBULATORY_CARE_PROVIDER_SITE_OTHER): Payer: Medicare Other | Admitting: Otolaryngology

## 2018-04-28 DIAGNOSIS — R490 Dysphonia: Secondary | ICD-10-CM

## 2018-04-28 DIAGNOSIS — K219 Gastro-esophageal reflux disease without esophagitis: Secondary | ICD-10-CM | POA: Diagnosis not present

## 2018-10-23 ENCOUNTER — Ambulatory Visit (INDEPENDENT_AMBULATORY_CARE_PROVIDER_SITE_OTHER): Payer: Medicare Other | Admitting: Otolaryngology

## 2018-10-23 DIAGNOSIS — R490 Dysphonia: Secondary | ICD-10-CM

## 2019-03-24 ENCOUNTER — Encounter (INDEPENDENT_AMBULATORY_CARE_PROVIDER_SITE_OTHER): Payer: Self-pay | Admitting: *Deleted

## 2019-06-02 ENCOUNTER — Other Ambulatory Visit (INDEPENDENT_AMBULATORY_CARE_PROVIDER_SITE_OTHER): Payer: Self-pay | Admitting: *Deleted

## 2019-06-02 DIAGNOSIS — Z86010 Personal history of colonic polyps: Secondary | ICD-10-CM

## 2019-06-04 ENCOUNTER — Encounter (INDEPENDENT_AMBULATORY_CARE_PROVIDER_SITE_OTHER): Payer: Self-pay | Admitting: *Deleted

## 2019-06-05 ENCOUNTER — Telehealth (INDEPENDENT_AMBULATORY_CARE_PROVIDER_SITE_OTHER): Payer: Self-pay | Admitting: *Deleted

## 2019-06-05 MED ORDER — SUTAB 1479-225-188 MG PO TABS: 1.0000 | ORAL_TABLET | Freq: Once | ORAL | 0 refills | Status: AC

## 2019-06-05 NOTE — Telephone Encounter (Signed)
Patient needs sutab (copay card) TCS sch'd 4/14

## 2019-06-10 ENCOUNTER — Other Ambulatory Visit: Payer: Self-pay

## 2019-06-10 ENCOUNTER — Telehealth (INDEPENDENT_AMBULATORY_CARE_PROVIDER_SITE_OTHER): Payer: Self-pay | Admitting: *Deleted

## 2019-06-10 ENCOUNTER — Ambulatory Visit (INDEPENDENT_AMBULATORY_CARE_PROVIDER_SITE_OTHER): Payer: Self-pay

## 2019-06-10 NOTE — Telephone Encounter (Addendum)
Referring MD/PCP: hunter   Procedure: tcs  Reason/Indication:  Hx polyps  Has patient had this procedure before?  yes, 2017   If so, when, by whom and where?    Is there a family history of colon cancer?  no  Who?  What age when diagnosed?    Is patient diabetic?   no      Does patient have prosthetic heart valve or mechanical valve?  no  Do you have a pacemaker/defibrillator?   no  Has patient ever had endocarditis/atrial fibrillation? no  Does patient use oxygen? no  Has patient had joint replacement within last 12 months?  no  Is patient constipated or do they take laxatives? no  Does patient have a history of alcohol/drug use?  no  Is patient on blood thinner such as Coumadin, Plavix and/or Aspirin? yes  Medications: asa 81 mg daily, advair 250/50 inhaler, albuterol nebulizer, amlodipine 5 mg daily, atorvastatin 20 mg daily  Allergies: nkda  Medication Adjustment per Dr Laural Golden: asa 2 days before   Procedure date & time: 07/08/19

## 2019-06-29 NOTE — Telephone Encounter (Signed)
Colonoscopy with conscious sedation 

## 2019-07-06 ENCOUNTER — Other Ambulatory Visit (HOSPITAL_COMMUNITY)
Admission: RE | Admit: 2019-07-06 | Discharge: 2019-07-06 | Disposition: A | Discharge status: Home / Self Care | Payer: Medicare HMO | Source: Ambulatory Visit | Attending: Internal Medicine | Admitting: Internal Medicine

## 2019-07-06 ENCOUNTER — Other Ambulatory Visit: Payer: Self-pay

## 2019-07-06 DIAGNOSIS — Z01812 Encounter for preprocedural laboratory examination: Secondary | ICD-10-CM | POA: Diagnosis present

## 2019-07-06 DIAGNOSIS — Z20822 Contact with and (suspected) exposure to covid-19: Secondary | ICD-10-CM | POA: Insufficient documentation

## 2019-07-06 LAB — SARS CORONAVIRUS 2 (TAT 6-24 HRS): SARS Coronavirus 2: NEGATIVE

## 2019-07-08 ENCOUNTER — Encounter (HOSPITAL_COMMUNITY): Payer: Self-pay | Admitting: Internal Medicine

## 2019-07-08 ENCOUNTER — Ambulatory Visit (HOSPITAL_COMMUNITY)
Admission: RE | Admit: 2019-07-08 | Discharge: 2019-07-08 | Disposition: A | Discharge status: Home / Self Care | Payer: Medicare HMO | Attending: Internal Medicine | Admitting: Internal Medicine

## 2019-07-08 ENCOUNTER — Other Ambulatory Visit: Payer: Self-pay

## 2019-07-08 ENCOUNTER — Encounter (HOSPITAL_COMMUNITY)
Admission: RE | Disposition: A | Discharge status: Home / Self Care | Payer: Self-pay | Source: Home / Self Care | Attending: Internal Medicine

## 2019-07-08 DIAGNOSIS — Z1211 Encounter for screening for malignant neoplasm of colon: Secondary | ICD-10-CM | POA: Insufficient documentation

## 2019-07-08 DIAGNOSIS — Z09 Encounter for follow-up examination after completed treatment for conditions other than malignant neoplasm: Secondary | ICD-10-CM | POA: Diagnosis not present

## 2019-07-08 DIAGNOSIS — Z86010 Personal history of colonic polyps: Secondary | ICD-10-CM | POA: Insufficient documentation

## 2019-07-08 DIAGNOSIS — Z79899 Other long term (current) drug therapy: Secondary | ICD-10-CM | POA: Insufficient documentation

## 2019-07-08 DIAGNOSIS — Z7951 Long term (current) use of inhaled steroids: Secondary | ICD-10-CM | POA: Diagnosis not present

## 2019-07-08 DIAGNOSIS — Z87891 Personal history of nicotine dependence: Secondary | ICD-10-CM | POA: Insufficient documentation

## 2019-07-08 DIAGNOSIS — K219 Gastro-esophageal reflux disease without esophagitis: Secondary | ICD-10-CM | POA: Insufficient documentation

## 2019-07-08 DIAGNOSIS — Z800 Family history of malignant neoplasm of digestive organs: Secondary | ICD-10-CM | POA: Diagnosis not present

## 2019-07-08 DIAGNOSIS — J449 Chronic obstructive pulmonary disease, unspecified: Secondary | ICD-10-CM | POA: Insufficient documentation

## 2019-07-08 HISTORY — PX: COLONOSCOPY: SHX5424

## 2019-07-08 SURGERY — COLONOSCOPY
Anesthesia: Moderate Sedation

## 2019-07-08 MED ORDER — MIDAZOLAM HCL 5 MG/5ML IJ SOLN
INTRAMUSCULAR | Status: DC | PRN
  Administered 2019-07-08 (×5): 2 mg via INTRAVENOUS

## 2019-07-08 MED ORDER — STERILE WATER FOR IRRIGATION IR SOLN
Status: DC | PRN
  Administered 2019-07-08: 1.5 mL

## 2019-07-08 MED ORDER — SODIUM CHLORIDE 0.9 % IV SOLN
INTRAVENOUS | Status: DC
  Administered 2019-07-08: 1000 mL via INTRAVENOUS

## 2019-07-08 MED ORDER — MEPERIDINE HCL 50 MG/ML IJ SOLN
INTRAMUSCULAR | Status: AC
  Filled 2019-07-08: qty 1

## 2019-07-08 MED ORDER — MEPERIDINE HCL 50 MG/ML IJ SOLN
INTRAMUSCULAR | Status: DC | PRN
  Administered 2019-07-08 (×2): 25 mg via INTRAVENOUS

## 2019-07-08 MED ORDER — MIDAZOLAM HCL 5 MG/5ML IJ SOLN
INTRAMUSCULAR | Status: AC
  Filled 2019-07-08: qty 10

## 2019-07-08 NOTE — Discharge Instructions (Signed)
Resume usual medications and diet as before. No driving for 24 hours. Next colonoscopy in 5 years.   Colonoscopy, Adult, Care After This sheet gives you information about how to care for yourself after your procedure. Your doctor may also give you more specific instructions. If you have problems or questions, call your doctor. What can I expect after the procedure? After the procedure, it is common to have:  A small amount of blood in your poop (stool) for 24 hours.  Some gas.  Mild cramping or bloating in your belly (abdomen). Follow these instructions at home: Eating and drinking   Drink enough fluid to keep your pee (urine) pale yellow.  Follow instructions from your doctor about what you cannot eat or drink.  Return to your normal diet as told by your doctor. Avoid heavy or fried foods that are hard to digest. Activity  Rest as told by your doctor.  Do not sit for a [place] time without moving. Get up to take short walks every 1-2 hours. This is important. Ask for help if you feel weak or unsteady.  Return to your normal activities as told by your doctor. Ask your doctor what activities are safe for you. To help cramping and bloating:   Try walking around.  Put heat on your belly as told by your doctor. Use the heat source that your doctor recommends, such as a moist heat pack or a heating pad. ? Put a towel between your skin and the heat source. ? Leave the heat on for 20-30 minutes. ? Remove the heat if your skin turns bright red. This is very important if you are unable to feel pain, heat, or cold. You may have a greater risk of getting burned. General instructions  For the first 24 hours after the procedure: ? Do not drive or use machinery. ? Do not sign important documents. ? Do not drink alcohol. ? Do your daily activities more slowly than normal. ? Eat foods that are soft and easy to digest.  Take over-the-counter or prescription medicines only as told by your  doctor.  Keep all follow-up visits as told by your doctor. This is important. Contact a doctor if:  You have blood in your poop 2-3 days after the procedure. Get help right away if:  You have more than a small amount of blood in your poop.  You see large clumps of tissue (blood clots) in your poop.  Your belly is swollen.  You feel like you may vomit (nauseous).  You vomit.  You have a fever.  You have belly pain that gets worse, and medicine does not help your pain. Summary  After the procedure, it is common to have a small amount of blood in your poop. You may also have mild cramping and bloating in your belly.  For the first 24 hours after the procedure, do not drive or use machinery, do not sign important documents, and do not drink alcohol.  Get help right away if you have a lot of blood in your poop, feel like you may vomit, have a fever, or have more belly pain. This information is not intended to replace advice given to you by your health care provider. Make sure you discuss any questions you have with your health care provider. Document Revised: 10/06/2018 Document Reviewed: 10/06/2018 Elsevier Patient Education  [place].

## 2019-07-08 NOTE — Op Note (Signed)
[facility] Patient Name: [name] Procedure Date: 07/08/2019 10:40 AM MRN: [id number] Date of Birth: 1955-04-05 Attending MD: Hildred Laser , MD CSN: [id number] Age: 64 Admit Type: Outpatient Procedure:                Colonoscopy Indications:              High risk colon cancer surveillance: Personal                            history of colonic polyps Providers:                Hildred Laser, MD, Otis Peak B. Sharon Seller, RN, Raphael Gibney, Technician Referring MD:             Abran Richard, MD Medicines:                Meperidine 50 mg IV, Midazolam 10 mg IV Complications:            No immediate complications. Estimated Blood Loss:     Estimated blood loss: none. Procedure:                Pre-Anesthesia Assessment:                           - Prior to the procedure, a History and Physical                            was performed, and patient medications and                            allergies were reviewed. The patient's tolerance of                            previous anesthesia was also reviewed. The risks                            and benefits of the procedure and the sedation                            options and risks were discussed with the patient.                            All questions were answered, and informed consent                            was obtained. Prior Anticoagulants: The patient has                            taken no previous anticoagulant or antiplatelet                            agents except for NSAID medication. ASA Grade  Assessment: II - A patient with mild systemic                            disease. After reviewing the risks and benefits,                            the patient was deemed in satisfactory condition to                            undergo the procedure.                           After obtaining informed consent, the colonoscope                            was passed under direct  vision. Throughout the                            procedure, the patient's blood pressure, pulse, and                            oxygen saturations were monitored continuously. The                            [id number]-H190DL [id number]) scope was introduced through                            the anus and advanced to the the cecum, identified                            by appendiceal orifice and ileocecal valve. The                            colonoscopy was performed without difficulty. The                            patient tolerated the procedure well. The quality                            of the bowel preparation was good. The ileocecal                            valve, appendiceal orifice, and rectum were                            photographed. Scope In: 11:40:05 AM Scope Out: 11:52:58 AM Scope Withdrawal Time: 0 hours 6 minutes 29 seconds  Total Procedure Duration: 0 hours 12 minutes 53 seconds  Findings:      The perianal and digital rectal examinations were normal.      The colon (entire examined portion) appeared normal.      The retroflexed view of the distal rectum and anal verge was normal and       showed no anal or rectal abnormalities. Impression:               -  The entire examined colon is normal.                           - No specimens collected.                           comment; no evidence of recurrent polyps. Moderate Sedation:      Moderate (conscious) sedation was administered by the endoscopy nurse       and supervised by the endoscopist. The following parameters were       monitored: oxygen saturation, heart rate, blood pressure, CO2       capnography and response to care. Total physician intraservice time was       20 minutes. Recommendation:           - Patient has a contact number available for                            emergencies. The signs and symptoms of potential                            delayed complications were discussed with the                             patient. Return to normal activities tomorrow.                            Written discharge instructions were provided to the                            patient.                           - Resume previous diet today.                           - Continue present medications.                           - Repeat colonoscopy in 5 years for surveillance. Procedure Code(s):        --- Professional ---                           [phone], Colonoscopy, flexible; diagnostic, including                            collection of specimen(s) by brushing or washing,                            when performed (separate procedure)                           G0500, Moderate sedation services provided by the                            same physician or other qualified health care  professional performing a gastrointestinal                            endoscopic service that sedation supports,                            requiring the presence of an independent trained                            observer to assist in the monitoring of the                            patient's level of consciousness and physiological                            status; initial 15 minutes of intra-service time;                            patient age 77 years or older (additional time may                            be reported with [phone], as appropriate) Diagnosis Code(s):        --- Professional ---                           Z86.010, Personal history of colonic polyps CPT copyright 2019 American Medical Association. All rights reserved. The codes documented in this report are preliminary and upon coder review may  be revised to meet current compliance requirements. Hildred Laser, MD Hildred Laser, MD 07/08/2019 12:01:49 PM This report has been signed electronically. Number of Addenda: 0

## 2019-07-08 NOTE — H&P (Signed)
[name] is an 64 y.o. male.   Chief Complaint: Patient is here for colonoscopy. HPI: Patient is 63 year old African-American male who had screening colonoscopy in December 2017 with removal of 9 small polyps and these are tubular adenomas.  Patient was advised to return for follow-up exam in 3 years.  He has no complaints.  He denies abdominal pain change in bowel habits or rectal bleeding. Family history significant for colon cancer in his brother who had surgery in his 30s and doing fine at age 8.  Past Medical History:  Diagnosis Date  . Asthma   . Bronchitis   . COPD (chronic obstructive pulmonary disease) ([place])   . GERD (gastroesophageal reflux disease)   . S/P endoscopy July 21, 2010   Dr. Gala Romney: limited endoscopy, secondary to food impaction, Schatzki's ring seen    Past Surgical History:  Procedure Laterality Date  . COLONOSCOPY N/A 03/22/2016   Procedure: COLONOSCOPY;  Surgeon: Rogene Houston, MD;  Location[facility];  Service: Endoscopy;  Laterality: N/A;  830  . lipoma removal from posterior neck      Family History  Problem Relation Age of Onset  . Bone cancer Mother        deceased in late 60s  . Aneurysm Father        deceased late 65s, brain  . Cancer Brother   . COPD Sister   . Colon cancer Neg Hx    Social History:  reports that he quit smoking about 2 years ago. His smoking use included cigarettes. He smoked 0.50 packs per day. He quit smokeless tobacco use about 9 years ago. He reports current alcohol use. He reports current drug use. Frequency: 2.00 times per week. Drug: Marijuana.  Allergies:  Allergies  Allergen Reactions  . Corn-Containing Products Itching  . Onion Itching  . Other     Squash and Tomatoes cause itching and whelps.   . Peanut-Containing Drug Products Other (See Comments)    Itching and [place]    Medications Prior to Admission  Medication Sig Dispense Refill  . albuterol (PROVENTIL) (2.5 MG/3ML) 0.083% nebulizer  solution Take 3 mLs (2.5 mg total) by nebulization every 6 (six) hours as needed. For shortness of breath and/or wheezing 75 mL 0  . albuterol (VENTOLIN HFA) 108 (90 Base) MCG/ACT inhaler Inhale 1 puff into the lungs every 6 (six) hours as needed for wheezing or shortness of breath.    Marland Kitchen amLODipine (NORVASC) 5 MG tablet Take 5 mg by mouth daily.    Marland Kitchen azelastine (ASTELIN) 0.1 % nasal spray Place 1 spray into both nostrils 2 (two) times daily. Use in each nostril as directed    . Fluticasone-Umeclidin-Vilant (TRELEGY ELLIPTA) 200-62.5-25 MCG/INH AEPB Inhale 1 puff into the lungs daily.    Marland Kitchen ibuprofen (ADVIL,MOTRIN) 200 MG tablet Take 1 tablet (200 mg total) by mouth every 6 (six) hours as needed for moderate pain. 30 tablet 0  . levocetirizine (XYZAL) 5 MG tablet Take 5 mg by mouth every evening.    Marland Kitchen omeprazole (PRILOSEC) 20 MG capsule Take 20 mg by mouth daily.    . sildenafil (VIAGRA) 50 MG tablet Take 50 mg by mouth daily as needed for erectile dysfunction.      No results found for this or any previous visit (from the past 48 hour(s)). No results found.  Review of Systems  Blood pressure (!) 145/79, pulse (!) 54, temperature 97.7 F (36.5 C), temperature source Oral, resp. rate 20, height 5'  10" (1.778 m), weight 64.4 kg, SpO2 100 %. Physical Exam  Constitutional:  Well-developed thin male in NAD.  HENT:  Mouth/Throat: Oropharynx is clear and moist.  Eyes: Conjunctivae are normal. No scleral icterus.  Neck: No thyromegaly present.  Cardiovascular: Normal rate, regular rhythm and normal heart sounds.  No murmur heard. Respiratory: Effort normal and breath sounds normal.  GI:  Abdomen is flat but small scar in left upper abdomen.  Abdomen is soft and nontender with organomegaly or masses.  Musculoskeletal:        General: No edema.  Lymphadenopathy:    He has no cervical adenopathy.  Neurological: He is alert.  Skin: Skin is warm and dry.     Assessment/Plan History of colonic  adenomas. Family history of CRC in a first-degree relative. Surveillance colonoscopy.  Hildred Laser, MD 07/08/2019, 11:29 AM

## 2020-06-19 ENCOUNTER — Emergency Department (HOSPITAL_COMMUNITY): Payer: Medicare HMO

## 2020-06-19 ENCOUNTER — Other Ambulatory Visit: Payer: Self-pay

## 2020-06-19 ENCOUNTER — Emergency Department (HOSPITAL_COMMUNITY)
Admission: EM | Admit: 2020-06-19 | Discharge: 2020-06-19 | Disposition: A | Discharge status: Home / Self Care | Payer: Medicare HMO | Attending: Emergency Medicine | Admitting: Emergency Medicine

## 2020-06-19 ENCOUNTER — Encounter (HOSPITAL_COMMUNITY): Payer: Self-pay | Admitting: Emergency Medicine

## 2020-06-19 DIAGNOSIS — Z91010 Allergy to peanuts: Secondary | ICD-10-CM | POA: Diagnosis not present

## 2020-06-19 DIAGNOSIS — K2980 Duodenitis without bleeding: Secondary | ICD-10-CM | POA: Diagnosis not present

## 2020-06-19 DIAGNOSIS — J45909 Unspecified asthma, uncomplicated: Secondary | ICD-10-CM | POA: Diagnosis not present

## 2020-06-19 DIAGNOSIS — Z87891 Personal history of nicotine dependence: Secondary | ICD-10-CM | POA: Insufficient documentation

## 2020-06-19 DIAGNOSIS — Z7951 Long term (current) use of inhaled steroids: Secondary | ICD-10-CM | POA: Diagnosis not present

## 2020-06-19 DIAGNOSIS — J441 Chronic obstructive pulmonary disease with (acute) exacerbation: Secondary | ICD-10-CM | POA: Insufficient documentation

## 2020-06-19 DIAGNOSIS — R072 Precordial pain: Secondary | ICD-10-CM | POA: Insufficient documentation

## 2020-06-19 DIAGNOSIS — Z79899 Other long term (current) drug therapy: Secondary | ICD-10-CM | POA: Insufficient documentation

## 2020-06-19 DIAGNOSIS — R1013 Epigastric pain: Secondary | ICD-10-CM | POA: Diagnosis present

## 2020-06-19 DIAGNOSIS — R001 Bradycardia, unspecified: Secondary | ICD-10-CM | POA: Insufficient documentation

## 2020-06-19 DIAGNOSIS — I10 Essential (primary) hypertension: Secondary | ICD-10-CM | POA: Insufficient documentation

## 2020-06-19 HISTORY — DX: Pure hypercholesterolemia, unspecified: E78.00

## 2020-06-19 HISTORY — DX: Essential (primary) hypertension: I10

## 2020-06-19 LAB — COMPREHENSIVE METABOLIC PANEL
ALT: 30 U/L (ref 0–44)
AST: 38 U/L (ref 15–41)
Albumin: 4.4 g/dL (ref 3.5–5.0)
Alkaline Phosphatase: 52 U/L (ref 38–126)
Anion gap: 14 (ref 5–15)
BUN: 12 mg/dL (ref 8–23)
CO2: 23 mmol/L (ref 22–32)
Calcium: 9.5 mg/dL (ref 8.9–10.3)
Chloride: 102 mmol/L (ref 98–111)
Creatinine, Ser: 0.74 mg/dL (ref 0.61–1.24)
GFR, Estimated: >60 mL/min (ref >60)
Glucose, Bld: 88 mg/dL (ref 70–99)
Potassium: 3.7 mmol/L (ref 3.5–5.1)
Sodium: 139 mmol/L (ref 135–145)
Total Bilirubin: 1 mg/dL (ref 0.3–1.2)
Total Protein: 7.4 g/dL (ref 6.5–8.1)

## 2020-06-19 LAB — LIPASE, BLOOD: Lipase: 31 U/L (ref 11–51)

## 2020-06-19 LAB — CBC WITH DIFFERENTIAL/PLATELET
Abs Immature Granulocytes: 0.01 10*3/uL (ref 0.00–0.07)
Basophils Absolute: 0.1 10*3/uL (ref 0.0–0.1)
Basophils Relative: 1 %
Eosinophils Absolute: 0.2 10*3/uL (ref 0.0–0.5)
Eosinophils Relative: 3 %
HCT: 40.5 % (ref 39.0–52.0)
Hemoglobin: 13.7 g/dL (ref 13.0–17.0)
Immature Granulocytes: 0 %
Lymphocytes Relative: 34 %
Lymphs Abs: 1.8 10*3/uL (ref 0.7–4.0)
MCH: 31 pg (ref 26.0–34.0)
MCHC: 33.8 g/dL (ref 30.0–36.0)
MCV: 91.6 fL (ref 80.0–100.0)
Monocytes Absolute: 0.5 10*3/uL (ref 0.1–1.0)
Monocytes Relative: 10 %
Neutro Abs: 2.8 10*3/uL (ref 1.7–7.7)
Neutrophils Relative %: 52 %
Platelets: 253 10*3/uL (ref 150–400)
RBC: 4.42 MIL/uL (ref 4.22–5.81)
RDW: 13.4 % (ref 11.5–15.5)
WBC: 5.3 10*3/uL (ref 4.0–10.5)
nRBC: 0 % (ref 0.0–0.2)

## 2020-06-19 LAB — TROPONIN I (HIGH SENSITIVITY)
Troponin I (High Sensitivity): 4 ng/L (ref <18)
Troponin I (High Sensitivity): 3 ng/L

## 2020-06-19 MED ORDER — IOHEXOL 300 MG/ML  SOLN
100.0000 mL | Freq: Once | INTRAMUSCULAR | Status: AC | PRN
  Administered 2020-06-19: 100 mL via INTRAVENOUS

## 2020-06-19 MED ORDER — ONDANSETRON HCL 4 MG/2ML IJ SOLN
4.0000 mg | Freq: Once | INTRAMUSCULAR | Status: AC
  Administered 2020-06-19: 4 mg via INTRAVENOUS
  Filled 2020-06-19: qty 2

## 2020-06-19 MED ORDER — OMEPRAZOLE 20 MG PO CPDR: 20.0000 mg | DELAYED_RELEASE_CAPSULE | Freq: Every day | ORAL | 0 refills | Status: DC

## 2020-06-19 MED ORDER — PANTOPRAZOLE SODIUM 40 MG IV SOLR
40.0000 mg | Freq: Once | INTRAVENOUS | Status: AC
  Administered 2020-06-19: 40 mg via INTRAVENOUS
  Filled 2020-06-19: qty 40

## 2020-06-19 MED ORDER — HYDROMORPHONE HCL 1 MG/ML IJ SOLN
0.5000 mg | Freq: Once | INTRAMUSCULAR | Status: AC
  Administered 2020-06-19: 0.5 mg via INTRAVENOUS
  Filled 2020-06-19: qty 1

## 2020-06-19 MED ORDER — TRAMADOL HCL 50 MG PO TABS: 50.0000 mg | ORAL_TABLET | Freq: Four times a day (QID) | ORAL | 0 refills | Status: DC | PRN

## 2020-06-19 NOTE — Discharge Instructions (Addendum)
Continue the Prilosec.  Make an appointment to follow-up with your GI doctor.  May require upper endoscopy.  CT scan shows inflammation around the first part of the small intestines which can be secondary to duodenal ulcer.  Take the tramadol as needed for pain.  Return for any new or worse symptoms.

## 2020-06-19 NOTE — ED Triage Notes (Signed)
Pt c/o of central cp x 5 days ago.  One nitro given by EMS with no relief.

## 2020-06-19 NOTE — ED Provider Notes (Addendum)
[facility] EMERGENCY DEPARTMENT Provider Note   CSN: [id number] Arrival date & time: 06/19/20  0747     History Chief Complaint  Patient presents with  . Chest Pain    [name] is a 65 y.o. male.  Patient with a complaint of substernal chest pain epigastric abdominal pain that radiates to the tip of his right shoulder it for the past 5 days.  It is constant in nature.  EMS gave patient nitroglycerin with no relief.  Patient's past medical history significant for COPD and gastroesophageal reflux disease.  But no known history of coronary artery disease.  However he does have risk factors.  To include hypercholesteremia and hypertension.  And he is a former smoker.  Not associated with any nausea or vomiting or shortness of breath.  Blood pressure elevated here some at 151/81.  Patient still has his gallbladder.          Past Medical History:  Diagnosis Date  . Asthma   . Bronchitis   . COPD (chronic obstructive pulmonary disease) ([place])   . GERD (gastroesophageal reflux disease)   . Hypercholesteremia   . Hypertension   . S/P endoscopy July 21, 2010   Dr. Gala Romney: limited endoscopy, secondary to food impaction, Schatzki's ring seen    Patient Active Problem List   Diagnosis Date Noted  . Special screening for malignant neoplasms, colon 12/07/2015  . COPD with acute exacerbation ([place]) 12/19/2011  . Acute respiratory failure ([place]) 02/20/2011  . COPD (chronic obstructive pulmonary disease) ([place]) 02/20/2011  . Anemia 02/20/2011  . Screening for colorectal cancer 08/08/2010  . Dysphagia 08/08/2010    Past Surgical History:  Procedure Laterality Date  . COLONOSCOPY N/A 03/22/2016   Procedure: COLONOSCOPY;  Surgeon: Rogene Houston, MD;  [facility];  Service: Endoscopy;  Laterality: N/A;  830  . COLONOSCOPY N/A 07/08/2019   Procedure: COLONOSCOPY;  Surgeon: Rogene Houston, MD;  Location: [facility];  Service: Endoscopy;  Laterality: N/A;  1200  .  lipoma removal from posterior neck         Family History  Problem Relation Age of Onset  . Bone cancer Mother        deceased in late 56s  . Aneurysm Father        deceased late 60s, brain  . Cancer Brother   . COPD Sister   . Colon cancer Neg Hx     Social History   Tobacco Use  . Smoking status: Former Smoker    Packs/day: 0.50    Types: Cigarettes    Quit date: 2019    Years since quitting: 3.2  . Smokeless tobacco: Former Systems developer    Quit date: 03/11/2010  Substance Use Topics  . Alcohol use: Yes    Comment: 6 pack a week sometime a couple of shots  . Drug use: Yes    Frequency: 2.0 times per week    Types: Marijuana    Comment: occ    Home Medications Prior to Admission medications   Medication Sig Start Date End Date Taking? Authorizing Provider  albuterol (PROVENTIL) (2.5 MG/3ML) 0.083% nebulizer solution Take 3 mLs (2.5 mg total) by nebulization every 6 (six) hours as needed. For shortness of breath and/or wheezing 12/21/11   Doree Albee, MD  albuterol (VENTOLIN HFA) 108 (90 Base) MCG/ACT inhaler Inhale 1 puff into the lungs every 6 (six) hours as needed for wheezing or shortness of breath.    [provider]  amLODipine (NORVASC) 5 MG tablet Take 5 mg by mouth daily.    [provider]  azelastine (ASTELIN) 0.1 % nasal spray Place 1 spray into both nostrils 2 (two) times daily. Use in each nostril as directed    [provider]  Fluticasone-Umeclidin-Vilant (TRELEGY ELLIPTA) 200-62.5-25 MCG/INH AEPB Inhale 1 puff into the lungs daily.    [provider]  levocetirizine (XYZAL) 5 MG tablet Take 5 mg by mouth every evening.    [provider]  omeprazole (PRILOSEC) 20 MG capsule Take 20 mg by mouth daily.    [provider]  sildenafil (VIAGRA) 50 MG tablet Take 50 mg by mouth daily as needed for erectile dysfunction.    [provider]    Allergies    Corn-containing products, Onion, Other, and  Peanut-containing drug products  Review of Systems   Review of Systems  Constitutional: Negative for chills and fever.  HENT: Negative for congestion, rhinorrhea and sore throat.   Eyes: Negative for visual disturbance.  Respiratory: Negative for cough and shortness of breath.   Cardiovascular: Positive for chest pain. Negative for leg swelling.  Gastrointestinal: Positive for abdominal pain. Negative for diarrhea, nausea and vomiting.  Genitourinary: Negative for dysuria.  Musculoskeletal: Negative for back pain and neck pain.  Skin: Negative for rash.  Neurological: Negative for dizziness, light-headedness and headaches.  Hematological: Does not bruise/bleed easily.  Psychiatric/Behavioral: Negative for confusion.    Physical Exam Updated Vital Signs BP (!) 153/86   Pulse (!) 54   Temp 97.9 F (36.6 C) (Oral)   Resp 14   Ht 1.778 m (5\' 10" )   Wt 63.5 kg   SpO2 100%   BMI 20.09 kg/m   Physical Exam Vitals and nursing note reviewed.  Constitutional:      Appearance: Normal appearance. He is well-developed.  HENT:     Head: Normocephalic and atraumatic.  Eyes:     Extraocular Movements: Extraocular movements intact.     Conjunctiva/sclera: Conjunctivae normal.     Pupils: Pupils are equal, round, and reactive to light.  Cardiovascular:     Rate and Rhythm: Regular rhythm. Bradycardia present.     Heart sounds: No murmur heard.   Pulmonary:     Effort: Pulmonary effort is normal. No respiratory distress.     Breath sounds: Normal breath sounds. No wheezing.  Chest:     Chest wall: No tenderness.  Abdominal:     General: There is no distension.     Palpations: Abdomen is soft.     Tenderness: There is no abdominal tenderness.  Musculoskeletal:        General: No swelling. Normal range of motion.     Cervical back: Normal range of motion and neck supple.     Right lower leg: No edema.  Skin:    General: Skin is warm and dry.     Capillary Refill: Capillary  refill takes less than 2 seconds.  Neurological:     General: No focal deficit present.     Mental Status: He is alert and oriented to person, place, and time.     Cranial Nerves: No cranial nerve deficit.     Sensory: No sensory deficit.     Motor: No weakness.     ED Results / Procedures / Treatments   Labs (all labs ordered are listed, but only abnormal results are displayed) Labs Reviewed  CBC WITH DIFFERENTIAL/PLATELET  COMPREHENSIVE METABOLIC PANEL  LIPASE, BLOOD  TROPONIN I (HIGH SENSITIVITY)  EKG EKG Interpretation  Date/Time:  Sunday June 19 2020 07:51:35 EDT Ventricular Rate:  57 PR Interval:    QRS Duration: 98 QT Interval:  461 QTC Calculation: 449 R Axis:   79 Text Interpretation: Sinus rhythm Anterior infarct, age indeterminate No significant change since last tracing Confirmed by Fredia Sorrow [phone]) on 06/19/2020 7:56:30 AM   Radiology DG Chest Port 1 View  Result Date: 06/19/2020 CLINICAL DATA:  Chest pain, shortness of breath EXAM: PORTABLE CHEST 1 VIEW COMPARISON:  09/01/2016 FINDINGS: Lungs are clear.  No pleural effusion or pneumothorax. The heart is normal in size. IMPRESSION: No evidence of acute cardiopulmonary disease. Electronically Signed   By: Julian Hy M.D.   On: 06/19/2020 08:23    Procedures Procedures   Medications Ordered in ED Medications  ondansetron (ZOFRAN) injection 4 mg (has no administration in time range)  HYDROmorphone (DILAUDID) injection 0.5 mg (has no administration in time range)    ED Course  I have reviewed the triage vital signs and the nursing notes.  Pertinent labs & imaging results that were available during my care of the patient were reviewed by me and considered in my medical decision making (see chart for details).    MDM Rules/Calculators/A&P                          EKG without any acute changes and no significant changes since 2016.  This is reassuring with 5 days worth of chest  pain. X-ray negative for any acute findings.  History more suggestive of may be acute gallbladder disease or biliary colic.  Because he talks about epigastric pain that radiates to the tip of the shoulder blade on the right.  Chest is nontender.  Abdomen is nontender in all quadrants.  CBC normal no leukocytosis.  Lipase not elevated.  Complete metabolic panel still pending.  Troponin still pending.  If troponin is normal this is very reassuring.  Based on the duration of the pain.  I will going get CT scan of the abdomen because ultrasound not available here today to evaluate the gallbladder.   Patient appears to be on Prilosec on a regular basis.  And has a 90-day supply.  CT scan of the abdomen shows evidence of inflammation around the duodenum.  Gallbladder is normal.  First troponin is normal.  Second troponin is pending.  If that is negative.  Will discharge patient home continuing his Prilosec.  We will give a dose of Protonix here have him follow-up with Dr. Melony Overly who is his gastroenterologist.  May need an upper endoscopy to rule out duodenal ulcer.  Patient nontoxic no acute distress.   Final Clinical Impression(s) / ED Diagnoses Final diagnoses:  Precordial pain  Epigastric pain    Rx / DC Orders ED Discharge Orders    None       Fredia Sorrow, MD 06/19/20 1007  Addendum:  Patient's second troponin is 3.  No CT evidence of acute coronary artery disease.  I think symptoms are all related to the duodenitis.  May represent duodenal ulcer.   Fredia Sorrow, MD 06/19/20 [phone]

## 2020-06-19 NOTE — ED Notes (Signed)
Nurse noted belching x 4 by pt.

## 2020-06-19 NOTE — ED Notes (Signed)
Pt currently asleep, RR even and unlabored. No s/s of distress observed. Call bell in reach. Bed low and locked. Connected to monitors. Will continue to monitor.

## 2020-06-19 NOTE — ED Notes (Signed)
DC instructions reviewed with pt including new prescription and how to take it. Explained he needs to make f/u appointment with his GI specialist for ongoing monitoring. Pt's family member to take him home. No additional questions at discharge.

## 2020-06-23 ENCOUNTER — Encounter (INDEPENDENT_AMBULATORY_CARE_PROVIDER_SITE_OTHER): Payer: Self-pay | Admitting: Gastroenterology

## 2020-06-23 ENCOUNTER — Ambulatory Visit (INDEPENDENT_AMBULATORY_CARE_PROVIDER_SITE_OTHER): Payer: Medicare HMO | Admitting: Gastroenterology

## 2020-06-23 ENCOUNTER — Other Ambulatory Visit: Payer: Self-pay

## 2020-06-23 ENCOUNTER — Encounter (INDEPENDENT_AMBULATORY_CARE_PROVIDER_SITE_OTHER): Payer: Self-pay

## 2020-06-23 DIAGNOSIS — K2100 Gastro-esophageal reflux disease with esophagitis, without bleeding: Secondary | ICD-10-CM | POA: Diagnosis not present

## 2020-06-23 DIAGNOSIS — K219 Gastro-esophageal reflux disease without esophagitis: Secondary | ICD-10-CM | POA: Insufficient documentation

## 2020-06-23 DIAGNOSIS — K2980 Duodenitis without bleeding: Secondary | ICD-10-CM

## 2020-06-23 MED ORDER — OMEPRAZOLE 40 MG PO CPDR: 40.0000 mg | DELAYED_RELEASE_CAPSULE | Freq: Two times a day (BID) | ORAL | 0 refills | Status: DC

## 2020-06-23 NOTE — H&P (View-Only) (Signed)
[name], M.D. Gastroenterology & Hepatology [facility] [address] [place], [place] [address] Primary Care Physician: Abran Richard, MD [address] [place] [address]  Referring MD: PCP  Chief Complaint:  Abdominal pain, duodenitis  History of Present Illness: [name] is a 65 y.o. male with GERD, asthma, COPD,  hyperlipidemia, hypertension and history of food impaction secondary to Schatzki's ring, who presents for evaluation of abdominal pain and duodenitis.  Patient reports that a week ago he presented new onset epigastric and periumbilical abdominal pain described as a pressure, which does not radiate anywhere else. He has also reported having pressure like pain in the middle of his chest. The pain fluctuates in severity, which can be up to 10/10 when it is worse.  He did not have these symptoms in the past.. He has tried using a heating pad which helps, also reports he has felt some improvement while taking tramadol that was prescribed in the ER. Sometimes eating causes the pain to ger worse, has been eating less because of the pain.  On 06/19/2020 he came to ER at [facility] due to persistent symptoms.  Blood work-up showed normal electrolytes and renal function, AST was elevated at 38 and ALT was 30, lipase was normal 31, alkaline phosphatase normal at 52 and total bili was 1.0.  CBC showed normal white blood cell count 4.3, hemoglobin 13.7 and platelets 253.  EKG was within normal limits and troponins were negative x2.  A chest x-ray was performed which was within normal limits.  He underwent subsequent CT of the abdomen and pelvis with IV contrast that showed thickening of the descending duodenum and diverticulosis without inflammation.  Patient was discharged on tramadol and was advised to follow-up with gastroenterology.  On the other hand, he reports new onset constipation.  He reports that he is only  able to have a bowel movement if he takes a laxative. He used to have a BM every day but this changed for the last month. He is currently taking milk of magnesia when he feels that he has significant abdominal discomfort.  The patient denies having any nausea, vomiting, fever, chills, hematochezia, melena, hematemesis, abdominal distention, dysphagia, odynophagia, diarrhea, jaundice, pruritus or weight loss.  Has been taking omeprazole 20 mg every day for management of his GERD, which he states controls his heartburn completely.  Denies intake of NSAIDs, anticoagulants, high dose aspirin, or any other antiplatelet. Takes Alka seltzer 2-3 times a month.  Last EGD: 2012 - for food impaction, had a esophageal ring Last Colonoscopy:06/2019 - normal  FHx: neg for any gastrointestinal/liver disease, mother bone cancer, brother prostate cancer Social: neg smoking. Smokes marihuana everyday. Drinks 4-5 beers a day, sometimes drinks 1/2 pint of liquor per day for multiple years Surgical: no abdominal surgeries  Past Medical History: Past Medical History:  Diagnosis Date  . Asthma   . Bronchitis   . COPD (chronic obstructive pulmonary disease) ([place])   . GERD (gastroesophageal reflux disease)   . Hypercholesteremia   . Hypertension   . S/P endoscopy July 21, 2010   Dr. Gala Romney: limited endoscopy, secondary to food impaction, Schatzki's ring seen    Past Surgical History: Past Surgical History:  Procedure Laterality Date  . COLONOSCOPY N/A 03/22/2016   Procedure: COLONOSCOPY;  Surgeon: Rogene Houston, MD;  [facility];  Service: Endoscopy;  Laterality: N/A;  830  . COLONOSCOPY N/A 07/08/2019   Procedure: COLONOSCOPY;  Surgeon: Rogene Houston, MD;  Location: [facility];  Service: Endoscopy;  Laterality: N/A;  1200  . lipoma removal from posterior neck      Family History: Family History  Problem Relation Age of Onset  . Bone cancer Mother        deceased in late 76s  .  Aneurysm Father        deceased late 59s, brain  . Cancer Brother   . COPD Sister   . Colon cancer Neg Hx     Social History: Social History   Tobacco Use  Smoking Status Former Smoker  . Packs/day: 0.50  . Types: Cigarettes  . Quit date: 2019  . Years since quitting: 3.2  Smokeless Tobacco Former Systems developer  . Quit date: 03/11/2010   Social History   Substance and Sexual Activity  Alcohol Use Not Currently   Comment: 6 pack a week sometime a couple of shots   Social History   Substance and Sexual Activity  Drug Use Yes  . Frequency: 2.0 times per week  . Types: Marijuana   Comment: occ    Allergies: Allergies  Allergen Reactions  . American Cockroach Itching  . Chocolate Other (See Comments)    reflux  . Corn-Containing Products Itching  . Dust Mite Extract Itching  . Grass Pollen(K-O-R-T-Swt Vern) Itching  . Onion Itching  . Other     Squash and Tomatoes cause itching and whelps.   . Peanut-Containing Drug Products Other (See Comments)    Itching and [place]  . Pollen Extract-Tree Extract [Pollen Extract] Itching    Medications: Current Outpatient Medications  Medication Sig Dispense Refill  . albuterol (PROVENTIL) (2.5 MG/3ML) 0.083% nebulizer solution Take 3 mLs (2.5 mg total) by nebulization every 6 (six) hours as needed. For shortness of breath and/or wheezing 75 mL 0  . albuterol (VENTOLIN HFA) 108 (90 Base) MCG/ACT inhaler Inhale 1 puff into the lungs every 6 (six) hours as needed for wheezing or shortness of breath.    Marland Kitchen amLODipine (NORVASC) 5 MG tablet Take 5 mg by mouth daily.    Marland Kitchen atorvastatin (LIPITOR) 20 MG tablet Take 20 mg by mouth daily.    Marland Kitchen azelastine (ASTELIN) 0.1 % nasal spray Place 1 spray into both nostrils 2 (two) times daily. Use in each nostril as directed    . Fluticasone-Umeclidin-Vilant (TRELEGY ELLIPTA) 200-62.5-25 MCG/INH AEPB Inhale 1 puff into the lungs daily.    Marland Kitchen omeprazole (PRILOSEC) 20 MG capsule Take 20 mg by mouth daily.     Marland Kitchen OVER THE COUNTER MEDICATION Allergy shots two shots Q Wednesday per patient for allergy to grass, cockroaches,dust mites, and trees.    . sildenafil (VIAGRA) 50 MG tablet Take 50 mg by mouth daily as needed for erectile dysfunction.    . traMADol (ULTRAM) 50 MG tablet Take 1 tablet (50 mg total) by mouth every 6 (six) hours as needed. 15 tablet 0  . levocetirizine (XYZAL) 5 MG tablet Take 5 mg by mouth every evening. (Patient not taking: Reported on 06/23/2020)     No current facility-administered medications for this visit.    Review of Systems: GENERAL: negative for malaise, night sweats HEENT: No changes in hearing or vision, no nose bleeds or other nasal problems. NECK: Negative for lumps, goiter, pain and significant neck swelling RESPIRATORY: Negative for cough, wheezing CARDIOVASCULAR: Negative for chest pain, leg swelling, palpitations, orthopnea GI: SEE HPI MUSCULOSKELETAL: Negative for joint pain or swelling, back pain, and muscle pain. SKIN: Negative  for lesions, rash PSYCH: Negative for sleep disturbance, mood disorder and recent psychosocial stressors. HEMATOLOGY Negative for prolonged bleeding, bruising easily, and swollen nodes. ENDOCRINE: Negative for cold or heat intolerance, polyuria, polydipsia and goiter. NEURO: negative for tremor, gait imbalance, syncope and seizures. The remainder of the review of systems is noncontributory.   Physical Exam: BP (!) 156/94 (BP Location: Left Arm, Patient Position: Sitting, Cuff Size: Small)   Pulse 75   Temp 98.7 F (37.1 C) (Oral)   Ht 5\' 9"  (1.753 m)   Wt 145 lb (65.8 kg)   BMI 21.41 kg/m  GENERAL: The patient is AO x3, in no acute distress. HEENT: Head is normocephalic and atraumatic. EOMI are intact. Mouth is well hydrated and without lesions. NECK: Supple. No masses LUNGS: Clear to auscultation. No presence of rhonchi/wheezing/rales. Adequate chest expansion HEART: RRR, normal s1 and s2. ABDOMEN: Soft, nontender, no  guarding, no peritoneal signs, and nondistended. BS +. No masses. EXTREMITIES: Without any cyanosis, clubbing, rash, lesions or edema. NEUROLOGIC: AOx3, no focal motor deficit. SKIN: no jaundice, no rashes   Imaging/Labs: as above  I personally reviewed and interpreted the available labs, imaging and endoscopic files.  Impression and Plan: [name] is a 65 y.o. male with GERD, asthma, COPD,  hyperlipidemia, hypertension and history of food impaction secondary to Schatzki's ring, who presents for evaluation of abdominal pain and duodenitis.  The patient had evidence of active inflammation in recent abdominal imaging suggestive of duodenitis that could explain his current presentation.  It is unclear why he has presented these although I explained to him that during intake of Alka-Seltzer could lead to inflammation or ulceration of the duodenum.  I advised him to avoid taking this medication or other NSAIDs which he understood.  At this point we will proceed with an EGD with gastric biopsies and I will increase his Prilosec to 40 mg twice a day dosing.  Regarding his constipation, he can try to take MiraLAX if he has persistence of the constipation despite taking milk of magnesia.  -Schedule EGD  -Increase Prilosec 40 mg twice a day -Advised to avoid NSAIDs and other medications such as [place] -May consider starting Miralax and checking TSH if he requires to use milk of magnesia often  All questions were answered.      Melvin Peppers, MD Gastroenterology and Hepatology [facility]

## 2020-06-23 NOTE — Patient Instructions (Signed)
Schedule EGD  Increase Prilosec 40 mg twice a day

## 2020-06-23 NOTE — Progress Notes (Signed)
Maylon Peppers, M.D. Gastroenterology & Hepatology [facility] [address] [place], [place] [address] Primary Care Physician: Abran Richard, MD [address] [place] [address]  Referring MD: PCP  Chief Complaint:  Abdominal pain, duodenitis  History of Present Illness: [name] is a 65 y.o. male with GERD, asthma, COPD,  hyperlipidemia, hypertension and history of food impaction secondary to Schatzki's ring, who presents for evaluation of abdominal pain and duodenitis.  Patient reports that a week ago he presented new onset epigastric and periumbilical abdominal pain described as a pressure, which does not radiate anywhere else. He has also reported having pressure like pain in the middle of his chest. The pain fluctuates in severity, which can be up to 10/10 when it is worse.  He did not have these symptoms in the past.. He has tried using a heating pad which helps, also reports he has felt some improvement while taking tramadol that was prescribed in the ER. Sometimes eating causes the pain to ger worse, has been eating less because of the pain.  On 06/19/2020 he came to ER at [facility] due to persistent symptoms.  Blood work-up showed normal electrolytes and renal function, AST was elevated at 38 and ALT was 30, lipase was normal 31, alkaline phosphatase normal at 52 and total bili was 1.0.  CBC showed normal white blood cell count 4.3, hemoglobin 13.7 and platelets 253.  EKG was within normal limits and troponins were negative x2.  A chest x-ray was performed which was within normal limits.  He underwent subsequent CT of the abdomen and pelvis with IV contrast that showed thickening of the descending duodenum and diverticulosis without inflammation.  Patient was discharged on tramadol and was advised to follow-up with gastroenterology.  On the other hand, he reports new onset constipation.  He reports that he is only  able to have a bowel movement if he takes a laxative. He used to have a BM every day but this changed for the last month. He is currently taking milk of magnesia when he feels that he has significant abdominal discomfort.  The patient denies having any nausea, vomiting, fever, chills, hematochezia, melena, hematemesis, abdominal distention, dysphagia, odynophagia, diarrhea, jaundice, pruritus or weight loss.  Has been taking omeprazole 20 mg every day for management of his GERD, which he states controls his heartburn completely.  Denies intake of NSAIDs, anticoagulants, high dose aspirin, or any other antiplatelet. Takes Alka seltzer 2-3 times a month.  Last EGD: 2012 - for food impaction, had a esophageal ring Last Colonoscopy:06/2019 - normal  FHx: neg for any gastrointestinal/liver disease, mother bone cancer, brother prostate cancer Social: neg smoking. Smokes marihuana everyday. Drinks 4-5 beers a day, sometimes drinks 1/2 pint of liquor per day for multiple years Surgical: no abdominal surgeries  Past Medical History: Past Medical History:  Diagnosis Date  . Asthma   . Bronchitis   . COPD (chronic obstructive pulmonary disease) ([place])   . GERD (gastroesophageal reflux disease)   . Hypercholesteremia   . Hypertension   . S/P endoscopy July 21, 2010   Dr. Gala Romney: limited endoscopy, secondary to food impaction, Schatzki's ring seen    Past Surgical History: Past Surgical History:  Procedure Laterality Date  . COLONOSCOPY N/A 03/22/2016   Procedure: COLONOSCOPY;  Surgeon: Rogene Houston, MD;  Location: [facility];  Service: Endoscopy;  Laterality: N/A;  830  . COLONOSCOPY N/A 07/08/2019   Procedure: COLONOSCOPY;  Surgeon: Rogene Houston, MD;  Location: [facility];  Service: Endoscopy;  Laterality: N/A;  1200  . lipoma removal from posterior neck      Family History: Family History  Problem Relation Age of Onset  . Bone cancer Mother        deceased in late 10s  .  Aneurysm Father        deceased late 27s, brain  . Cancer Brother   . COPD Sister   . Colon cancer Neg Hx     Social History: Social History   Tobacco Use  Smoking Status Former Smoker  . Packs/day: 0.50  . Types: Cigarettes  . Quit date: 2019  . Years since quitting: 3.2  Smokeless Tobacco Former Systems developer  . Quit date: 03/11/2010   Social History   Substance and Sexual Activity  Alcohol Use Not Currently   Comment: 6 pack a week sometime a couple of shots   Social History   Substance and Sexual Activity  Drug Use Yes  . Frequency: 2.0 times per week  . Types: Marijuana   Comment: occ    Allergies: Allergies  Allergen Reactions  . American Cockroach Itching  . Chocolate Other (See Comments)    reflux  . Corn-Containing Products Itching  . Dust Mite Extract Itching  . Grass Pollen(K-O-R-T-Swt Vern) Itching  . Onion Itching  . Other     Squash and Tomatoes cause itching and whelps.   . Peanut-Containing Drug Products Other (See Comments)    Itching and Whelps  . Pollen Extract-Tree Extract [Pollen Extract] Itching    Medications: Current Outpatient Medications  Medication Sig Dispense Refill  . albuterol (PROVENTIL) (2.5 MG/3ML) 0.083% nebulizer solution Take 3 mLs (2.5 mg total) by nebulization every 6 (six) hours as needed. For shortness of breath and/or wheezing 75 mL 0  . albuterol (VENTOLIN HFA) 108 (90 Base) MCG/ACT inhaler Inhale 1 puff into the lungs every 6 (six) hours as needed for wheezing or shortness of breath.    Marland Kitchen amLODipine (NORVASC) 5 MG tablet Take 5 mg by mouth daily.    Marland Kitchen atorvastatin (LIPITOR) 20 MG tablet Take 20 mg by mouth daily.    Marland Kitchen azelastine (ASTELIN) 0.1 % nasal spray Place 1 spray into both nostrils 2 (two) times daily. Use in each nostril as directed    . Fluticasone-Umeclidin-Vilant (TRELEGY ELLIPTA) 200-62.5-25 MCG/INH AEPB Inhale 1 puff into the lungs daily.    Marland Kitchen omeprazole (PRILOSEC) 20 MG capsule Take 20 mg by mouth daily.     Marland Kitchen OVER THE COUNTER MEDICATION Allergy shots two shots Q Wednesday per patient for allergy to grass, cockroaches,dust mites, and trees.    . sildenafil (VIAGRA) 50 MG tablet Take 50 mg by mouth daily as needed for erectile dysfunction.    . traMADol (ULTRAM) 50 MG tablet Take 1 tablet (50 mg total) by mouth every 6 (six) hours as needed. 15 tablet 0  . levocetirizine (XYZAL) 5 MG tablet Take 5 mg by mouth every evening. (Patient not taking: Reported on 06/23/2020)     No current facility-administered medications for this visit.    Review of Systems: GENERAL: negative for malaise, night sweats HEENT: No changes in hearing or vision, no nose bleeds or other nasal problems. NECK: Negative for lumps, goiter, pain and significant neck swelling RESPIRATORY: Negative for cough, wheezing CARDIOVASCULAR: Negative for chest pain, leg swelling, palpitations, orthopnea GI: SEE HPI MUSCULOSKELETAL: Negative for joint pain or swelling, back pain, and muscle pain. SKIN: Negative  for lesions, rash PSYCH: Negative for sleep disturbance, mood disorder and recent psychosocial stressors. HEMATOLOGY Negative for prolonged bleeding, bruising easily, and swollen nodes. ENDOCRINE: Negative for cold or heat intolerance, polyuria, polydipsia and goiter. NEURO: negative for tremor, gait imbalance, syncope and seizures. The remainder of the review of systems is noncontributory.   Physical Exam: BP (!) 156/94 (BP Location: Left Arm, Patient Position: Sitting, Cuff Size: Small)   Pulse 75   Temp 98.7 F (37.1 C) (Oral)   Ht 5\' 9"  (1.753 m)   Wt 145 lb (65.8 kg)   BMI 21.41 kg/m  GENERAL: The patient is AO x3, in no acute distress. HEENT: Head is normocephalic and atraumatic. EOMI are intact. Mouth is well hydrated and without lesions. NECK: Supple. No masses LUNGS: Clear to auscultation. No presence of rhonchi/wheezing/rales. Adequate chest expansion HEART: RRR, normal s1 and s2. ABDOMEN: Soft, nontender, no  guarding, no peritoneal signs, and nondistended. BS +. No masses. EXTREMITIES: Without any cyanosis, clubbing, rash, lesions or edema. NEUROLOGIC: AOx3, no focal motor deficit. SKIN: no jaundice, no rashes   Imaging/Labs: as above  I personally reviewed and interpreted the available labs, imaging and endoscopic files.  Impression and Plan: [name] is a 65 y.o. male with GERD, asthma, COPD,  hyperlipidemia, hypertension and history of food impaction secondary to Schatzki's ring, who presents for evaluation of abdominal pain and duodenitis.  The patient had evidence of active inflammation in recent abdominal imaging suggestive of duodenitis that could explain his current presentation.  It is unclear why he has presented these although I explained to him that during intake of Alka-Seltzer could lead to inflammation or ulceration of the duodenum.  I advised him to avoid taking this medication or other NSAIDs which he understood.  At this point we will proceed with an EGD with gastric biopsies and I will increase his Prilosec to 40 mg twice a day dosing.  Regarding his constipation, he can try to take MiraLAX if he has persistence of the constipation despite taking milk of magnesia.  -Schedule EGD  -Increase Prilosec 40 mg twice a day -Advised to avoid NSAIDs and other medications such as [place] -May consider starting Miralax and checking TSH if he requires to use milk of magnesia often  All questions were answered.      Maylon Peppers, MD Gastroenterology and Hepatology [facility]

## 2020-06-24 ENCOUNTER — Encounter (HOSPITAL_COMMUNITY): Payer: Self-pay | Admitting: Gastroenterology

## 2020-06-24 ENCOUNTER — Other Ambulatory Visit (INDEPENDENT_AMBULATORY_CARE_PROVIDER_SITE_OTHER): Payer: Self-pay

## 2020-06-27 ENCOUNTER — Other Ambulatory Visit: Payer: Self-pay

## 2020-06-27 ENCOUNTER — Other Ambulatory Visit (HOSPITAL_COMMUNITY)
Admission: RE | Admit: 2020-06-27 | Discharge: 2020-06-27 | Disposition: A | Discharge status: Home / Self Care | Payer: Medicare HMO | Source: Ambulatory Visit | Attending: Gastroenterology | Admitting: Gastroenterology

## 2020-06-27 DIAGNOSIS — Z20822 Contact with and (suspected) exposure to covid-19: Secondary | ICD-10-CM | POA: Insufficient documentation

## 2020-06-27 DIAGNOSIS — Z01812 Encounter for preprocedural laboratory examination: Secondary | ICD-10-CM | POA: Insufficient documentation

## 2020-06-28 ENCOUNTER — Encounter (HOSPITAL_COMMUNITY): Payer: Self-pay | Admitting: Gastroenterology

## 2020-06-28 ENCOUNTER — Ambulatory Visit (HOSPITAL_COMMUNITY): Payer: Medicare HMO | Admitting: Certified Registered Nurse Anesthetist

## 2020-06-28 ENCOUNTER — Ambulatory Visit (HOSPITAL_COMMUNITY)
Admission: RE | Admit: 2020-06-28 | Discharge: 2020-06-28 | Disposition: A | Discharge status: Home / Self Care | Payer: Medicare HMO | Attending: Gastroenterology | Admitting: Gastroenterology

## 2020-06-28 ENCOUNTER — Other Ambulatory Visit: Payer: Self-pay

## 2020-06-28 ENCOUNTER — Encounter (HOSPITAL_COMMUNITY)
Admission: RE | Disposition: A | Discharge status: Home / Self Care | Payer: Self-pay | Source: Home / Self Care | Attending: Gastroenterology

## 2020-06-28 DIAGNOSIS — R1013 Epigastric pain: Secondary | ICD-10-CM

## 2020-06-28 DIAGNOSIS — J449 Chronic obstructive pulmonary disease, unspecified: Secondary | ICD-10-CM | POA: Insufficient documentation

## 2020-06-28 DIAGNOSIS — K2980 Duodenitis without bleeding: Secondary | ICD-10-CM | POA: Diagnosis not present

## 2020-06-28 DIAGNOSIS — K222 Esophageal obstruction: Secondary | ICD-10-CM | POA: Insufficient documentation

## 2020-06-28 DIAGNOSIS — Z825 Family history of asthma and other chronic lower respiratory diseases: Secondary | ICD-10-CM | POA: Diagnosis not present

## 2020-06-28 DIAGNOSIS — Z8249 Family history of ischemic heart disease and other diseases of the circulatory system: Secondary | ICD-10-CM | POA: Insufficient documentation

## 2020-06-28 DIAGNOSIS — Z808 Family history of malignant neoplasm of other organs or systems: Secondary | ICD-10-CM | POA: Insufficient documentation

## 2020-06-28 DIAGNOSIS — Z79899 Other long term (current) drug therapy: Secondary | ICD-10-CM | POA: Insufficient documentation

## 2020-06-28 DIAGNOSIS — K219 Gastro-esophageal reflux disease without esophagitis: Secondary | ICD-10-CM | POA: Diagnosis not present

## 2020-06-28 DIAGNOSIS — E785 Hyperlipidemia, unspecified: Secondary | ICD-10-CM | POA: Diagnosis not present

## 2020-06-28 HISTORY — PX: BIOPSY: SHX5522

## 2020-06-28 HISTORY — PX: ESOPHAGOGASTRODUODENOSCOPY (EGD) WITH PROPOFOL: SHX5813

## 2020-06-28 LAB — SARS CORONAVIRUS 2 (TAT 6-24 HRS): SARS Coronavirus 2: NEGATIVE

## 2020-06-28 SURGERY — ESOPHAGOGASTRODUODENOSCOPY (EGD) WITH PROPOFOL
Anesthesia: General

## 2020-06-28 MED ORDER — LIDOCAINE HCL (CARDIAC) PF 100 MG/5ML IV SOSY
PREFILLED_SYRINGE | INTRAVENOUS | Status: DC | PRN
  Administered 2020-06-28: 50 mg via INTRATRACHEAL

## 2020-06-28 MED ORDER — PROPOFOL 10 MG/ML IV BOLUS
INTRAVENOUS | Status: DC | PRN
  Administered 2020-06-28: 50 mg via INTRAVENOUS

## 2020-06-28 MED ORDER — GLYCOPYRROLATE 0.2 MG/ML IJ SOLN
INTRAMUSCULAR | Status: DC | PRN
  Administered 2020-06-28: .1 mg via INTRAVENOUS

## 2020-06-28 MED ORDER — LACTATED RINGERS IV SOLN: INTRAVENOUS | Status: DC | PRN

## 2020-06-28 MED ORDER — KETAMINE HCL 50 MG/5ML IJ SOSY
PREFILLED_SYRINGE | INTRAMUSCULAR | Status: AC
  Filled 2020-06-28: qty 5

## 2020-06-28 MED ORDER — PROPOFOL 500 MG/50ML IV EMUL
INTRAVENOUS | Status: DC | PRN
  Administered 2020-06-28: 175 ug/kg/min via INTRAVENOUS

## 2020-06-28 MED ORDER — PHENYLEPHRINE 40 MCG/ML (10ML) SYRINGE FOR IV PUSH (FOR BLOOD PRESSURE SUPPORT)
PREFILLED_SYRINGE | INTRAVENOUS | Status: AC
  Filled 2020-06-28: qty 20

## 2020-06-28 MED ORDER — KETAMINE HCL 10 MG/ML IJ SOLN
INTRAMUSCULAR | Status: DC | PRN
  Administered 2020-06-28: 15 mg via INTRAVENOUS

## 2020-06-28 NOTE — Transfer of Care (Signed)
Immediate Anesthesia Transfer of Care Note  Patient: [name]  Procedure(s) Performed: ESOPHAGOGASTRODUODENOSCOPY (EGD) WITH PROPOFOL (N/A ) BIOPSY  Patient Location: PACU  Anesthesia Type:General  Level of Consciousness: awake  Airway & Oxygen Therapy: Patient Spontanous Breathing  Post-op Assessment: Report given to RN and Post -op Vital signs reviewed and stable  Post vital signs: Reviewed and stable  Last Vitals:  Vitals Value Taken Time  BP    Temp    Pulse    Resp    SpO2      Last Pain:  Vitals:   06/28/20 1107  TempSrc: Oral  PainSc: 0-No pain      Patients Stated Pain Goal: 6 (50/41/36 4383)  Complications: No complications documented.

## 2020-06-28 NOTE — Discharge Instructions (Signed)
You are being discharged to home.  Resume your previous diet.  Continue your present medications, take pantoprazole 40 mg twice a day for total of 2 months, then can stop.  We are waiting for your pathology results.   PATIENT INSTRUCTIONS POST-ANESTHESIA  IMMEDIATELY FOLLOWING SURGERY:  Do not drive or operate machinery for the first twenty four hours after surgery.  Do not make any important decisions for twenty four hours after surgery or while taking narcotic pain medications or sedatives.  If you develop intractable nausea and vomiting or a severe headache please notify your doctor immediately.  FOLLOW-UP:  Please make an appointment with your surgeon as instructed. You do not need to follow up with anesthesia unless specifically instructed to do so.  WOUND CARE INSTRUCTIONS (if applicable):  Keep a dry clean dressing on the anesthesia/puncture wound site if there is drainage.  Once the wound has quit draining you may leave it open to air.  Generally you should leave the bandage intact for twenty four hours unless there is drainage.  If the epidural site drains for more than 36-48 hours please call the anesthesia department.  QUESTIONS?:  Please feel free to call your physician or the hospital operator if you have any questions, and they will be happy to assist you.      Upper Endoscopy, Adult, Care After This sheet gives you information about how to care for yourself after your procedure. Your health care provider may also give you more specific instructions. If you have problems or questions, contact your health care provider. What can I expect after the procedure? After the procedure, it is common to have:  A sore throat.  Mild stomach pain or discomfort.  Bloating.  Nausea. Follow these instructions at home:  Follow instructions from your health care provider about what to eat or drink after your procedure.  Return to your normal activities as told by your health care  provider. Ask your health care provider what activities are safe for you.  Take over-the-counter and prescription medicines only as told by your health care provider.  If you were given a sedative during the procedure, it can affect you for several hours. Do not drive or operate machinery until your health care provider says that it is safe.  Keep all follow-up visits as told by your health care provider. This is important.   Contact a health care provider if you have:  A sore throat that lasts longer than one day.  Trouble swallowing. Get help right away if:  You vomit blood or your vomit looks like coffee grounds.  You have: ? A fever. ? Bloody, black, or tarry stools. ? A severe sore throat or you cannot swallow. ? Difficulty breathing. ? Severe pain in your chest or abdomen. Summary  After the procedure, it is common to have a sore throat, mild stomach discomfort, bloating, and nausea.  If you were given a sedative during the procedure, it can affect you for several hours. Do not drive or operate machinery until your health care provider says that it is safe.  Follow instructions from your health care provider about what to eat or drink after your procedure.  Return to your normal activities as told by your health care provider. This information is not intended to replace advice given to you by your health care provider. Make sure you discuss any questions you have with your health care provider. Document Revised: 03/10/2019 Document Reviewed: 08/12/2017 Elsevier Patient Education  2021 Elsevier  oral health. Ask if you should start using fluoride toothpaste to clean your child's teeth. This information is not intended to replace advice given to you by your health care provider. Make sure you discuss any questions you have with your health care provider. Document Revised: 07/01/2018 Document Reviewed: 12/06/2017 Elsevier Patient Education  2021 Elsevier Inc.  

## 2020-06-28 NOTE — Op Note (Addendum)
[facility] Patient Name: [name] Procedure Date: 06/28/2020 11:31 AM MRN: [id number] Date of Birth: 09-17-1955 Attending MD: Maylon Peppers ,  CSN: [id number] Age: 65 Admit Type: Outpatient Procedure:                Upper GI endoscopy Indications:              Epigastric abdominal pain Providers:                Maylon Peppers, Janeece Riggers, RN, Nelma Rothman,                            Technician Referring MD:              Medicines:                Monitored Anesthesia Care Complications:            No immediate complications. Estimated Blood Loss:     Estimated blood loss: none. Procedure:                Pre-Anesthesia Assessment:                           - Prior to the procedure, a History and Physical                            was performed, and patient medications, allergies                            and sensitivities were reviewed. The patient's                            tolerance of previous anesthesia was reviewed.                           - The risks and benefits of the procedure and the                            sedation options and risks were discussed with the                            patient. All questions were answered and informed                            consent was obtained.                           - ASA Grade Assessment: II - A patient with mild                            systemic disease.                           After obtaining informed consent, the endoscope was                            passed under direct vision. Throughout the  procedure, the patient's blood pressure, pulse, and                            oxygen saturations were monitored continuously. The                            GIF-H190 ([id number]) scope was introduced through the                            mouth, and advanced to the second part of duodenum.                            The upper GI endoscopy was accomplished without                             difficulty. The patient tolerated the procedure                            well. Scope In: 12:37:33 PM Scope Out: 12:43:21 PM Total Procedure Duration: 0 hours 5 minutes 48 seconds  Findings:      The examined esophagus was normal.      The entire examined stomach was normal. Imaging was performed using       white light and narrow band imaging to visualize the mucosa. Biopsies       were taken with a cold forceps for Helicobacter pylori testing.      The examined duodenum was normal. Impression:               - Normal esophagus.                           - Normal stomach. Biopsied.                           - Normal examined duodenum. Moderate Sedation:      Per Anesthesia Care Recommendation:           - Discharge patient to home (ambulatory).                           - Resume previous diet.                           - Continue present medications, take omeprazole 40                            mg twice a day for total of 2 months, then can stop.                           - Await pathology results. Procedure Code(s):        --- Professional ---                           [phone], Esophagogastroduodenoscopy, flexible,                            transoral; with  biopsy, single or multiple Diagnosis Code(s):        --- Professional ---                           R10.13, Epigastric pain CPT copyright 2019 American Medical Association. All rights reserved. The codes documented in this report are preliminary and upon coder review may  be revised to meet current compliance requirements. Maylon Peppers, MD Maylon Peppers,  06/28/2020 12:49:51 PM This report has been signed electronically. Number of Addenda: 0

## 2020-06-28 NOTE — Anesthesia Postprocedure Evaluation (Signed)
Anesthesia Post Note  Patient: [name]  Procedure(s) Performed: ESOPHAGOGASTRODUODENOSCOPY (EGD) WITH PROPOFOL (N/A ) BIOPSY  Patient location during evaluation: Phase II Anesthesia Type: General Level of consciousness: awake and alert and oriented Pain management: pain level controlled Vital Signs Assessment: post-procedure vital signs reviewed and stable Respiratory status: spontaneous breathing, nonlabored ventilation and respiratory function stable Cardiovascular status: stable Postop Assessment: no apparent nausea or vomiting Anesthetic complications: no   No complications documented.   Last Vitals:  Vitals:   06/28/20 1107 06/28/20 1245  BP: (!) 154/100 103/71  Pulse: (!) 53 76  Resp: 17 17  Temp: 36.9 C 36.8 C  SpO2: 100% 98%    Last Pain:  Vitals:   06/28/20 1245  TempSrc: Oral  PainSc: 0-No pain                 Reginald Mangels [place]

## 2020-06-28 NOTE — Anesthesia Preprocedure Evaluation (Signed)
Anesthesia Evaluation  Patient identified by MRN, date of birth, ID band Patient awake    Reviewed: Allergy & Precautions, H&P , NPO status , Patient's Chart, lab work & pertinent test results, reviewed documented beta blocker date and time   Airway Mallampati: II  TM Distance: >3 FB Neck ROM: full    Dental no notable dental hx.    Pulmonary asthma , COPD, former smoker,    Pulmonary exam normal breath sounds clear to auscultation       Cardiovascular Exercise Tolerance: Good hypertension, negative cardio ROS   Rhythm:regular Rate:Normal     Neuro/Psych negative neurological ROS  negative psych ROS   GI/Hepatic Neg liver ROS, GERD  Medicated,  Endo/Other  negative endocrine ROS  Renal/GU negative Renal ROS  negative genitourinary   Musculoskeletal   Abdominal   Peds  Hematology  (+) Blood dyscrasia, anemia ,   Anesthesia Other Findings   Reproductive/Obstetrics negative OB ROS                             Anesthesia Physical Anesthesia Plan  ASA: II  Anesthesia Plan: General   Post-op Pain Management:    Induction:   PONV Risk Score and Plan:   Airway Management Planned:   Additional Equipment:   Intra-op Plan:   Post-operative Plan:   Informed Consent: I have reviewed the patients History and Physical, chart, labs and discussed the procedure including the risks, benefits and alternatives for the proposed anesthesia with the patient or authorized representative who has indicated his/her understanding and acceptance.     Dental Advisory Given  Plan Discussed with: CRNA  Anesthesia Plan Comments:         Anesthesia Quick Evaluation

## 2020-06-28 NOTE — Interval H&P Note (Signed)
History and Physical Interval Note:  06/28/2020 11:32 AM [name] is a 65 y.o. male with GERD, asthma, COPD,  hyperlipidemia, hypertension and history of food impaction secondary to Schatzki's ring, who presents for evaluation of abdominal pain and duodenitis.  Patient reports that since the last time he was seen in clinic his pain has subsided and he is not having any more complaints.  States that he has some recent constipation but has not started any laxatives yet.  Denies any nausea, vomiting, fever, chills, melena or hematochezia.  He switched to 40 mg of pantoprazole every 12 hours.  BP (!) 154/100   Pulse (!) 53   Temp 98.4 F (36.9 C) (Oral)   Resp 17   Ht 5\' 9"  (1.753 m)   Wt 65.3 kg   SpO2 100%   BMI 21.27 kg/m  GENERAL: The patient is AO x3, in no acute distress. HEENT: Head is normocephalic and atraumatic. EOMI are intact. Mouth is well hydrated and without lesions. NECK: Supple. No masses LUNGS: Clear to auscultation. No presence of rhonchi/wheezing/rales. Adequate chest expansion HEART: RRR, normal s1 and s2. ABDOMEN: Soft, nontender, no guarding, no peritoneal signs, and nondistended. BS +. No masses. EXTREMITIES: Without any cyanosis, clubbing, rash, lesions or edema. NEUROLOGIC: AOx3, no focal motor deficit. SKIN: no jaundice, no rashes  [name]  has presented today for surgery, with the diagnosis of Duodenitis.  The various methods of treatment have been discussed with the patient and family. After consideration of risks, benefits and other options for treatment, the patient has consented to  Procedure(s) with comments: ESOPHAGOGASTRODUODENOSCOPY (EGD) WITH PROPOFOL (N/A) - PM as a surgical intervention.  The patient's history has been reviewed, patient examined, no change in status, stable for surgery.  I have reviewed the patient's chart and labs.  Questions were answered to the patient's satisfaction.     [name]

## 2020-06-29 LAB — SURGICAL PATHOLOGY

## 2020-07-04 ENCOUNTER — Encounter (HOSPITAL_COMMUNITY): Payer: Self-pay | Admitting: Gastroenterology

## 2020-07-11 ENCOUNTER — Other Ambulatory Visit (HOSPITAL_COMMUNITY): Payer: Self-pay | Admitting: Internal Medicine

## 2020-07-11 ENCOUNTER — Other Ambulatory Visit: Payer: Self-pay | Admitting: Internal Medicine

## 2020-07-11 DIAGNOSIS — Z122 Encounter for screening for malignant neoplasm of respiratory organs: Secondary | ICD-10-CM

## 2020-08-04 ENCOUNTER — Ambulatory Visit (INDEPENDENT_AMBULATORY_CARE_PROVIDER_SITE_OTHER): Payer: Medicare HMO | Admitting: Internal Medicine

## 2020-08-08 ENCOUNTER — Ambulatory Visit (HOSPITAL_COMMUNITY)
Admission: RE | Admit: 2020-08-08 | Discharge: 2020-08-08 | Disposition: A | Discharge status: Home / Self Care | Payer: Medicare HMO | Source: Ambulatory Visit | Attending: Internal Medicine | Admitting: Internal Medicine

## 2020-08-08 DIAGNOSIS — I700 Atherosclerosis of aorta: Secondary | ICD-10-CM | POA: Insufficient documentation

## 2020-08-08 DIAGNOSIS — Z87891 Personal history of nicotine dependence: Secondary | ICD-10-CM | POA: Diagnosis not present

## 2020-08-08 DIAGNOSIS — Z122 Encounter for screening for malignant neoplasm of respiratory organs: Secondary | ICD-10-CM | POA: Diagnosis not present

## 2020-08-08 DIAGNOSIS — J432 Centrilobular emphysema: Secondary | ICD-10-CM | POA: Insufficient documentation

## 2021-06-26 ENCOUNTER — Other Ambulatory Visit (HOSPITAL_COMMUNITY): Payer: Self-pay | Admitting: Internal Medicine

## 2021-06-26 DIAGNOSIS — Z122 Encounter for screening for malignant neoplasm of respiratory organs: Secondary | ICD-10-CM

## 2021-07-04 ENCOUNTER — Encounter: Payer: Self-pay | Admitting: Surgery

## 2021-07-04 ENCOUNTER — Other Ambulatory Visit: Payer: Self-pay

## 2021-07-04 ENCOUNTER — Ambulatory Visit (INDEPENDENT_AMBULATORY_CARE_PROVIDER_SITE_OTHER): Payer: Medicare HMO | Admitting: Surgery

## 2021-07-04 VITALS — BP 152/91 | HR 64 | Temp 98.4°F | Resp 14 | Ht 69.0 in | Wt 135.0 lb

## 2021-07-04 DIAGNOSIS — D2321 Other benign neoplasm of skin of right ear and external auricular canal: Secondary | ICD-10-CM

## 2021-07-04 NOTE — Progress Notes (Signed)
[facility] History and Physical ? ?Reason for Referral: Right ear cyst ?Referring Physician: Abran Richard, MD ? ?Chief Complaint   ?New Patient (Initial Visit) ?  ? ? ?[name] is a 66 y.o. male.  ?HPI: Patient presents for evaluation of right ear cyst.  It has been present for 7 to 8 months.  The patient denies any pain or drainage associated with it.  He did try to pop it once, with only a minimal amount of white drainage.  He has a history of sebaceous cyst, with an excision of a posterior neck sebaceous cyst previously performed.  He otherwise has had no other surgeries.  His past medical history significant for hypertension, allergies, and GERD.  He denies use of blood thinning medications.  He occasionally will drink alcohol and smokes marijuana daily.  He denies use of tobacco products. ? ?Past Medical History:  ?Diagnosis Date  ? Asthma   ? Bronchitis   ? COPD (chronic obstructive pulmonary disease) ([place])   ? GERD (gastroesophageal reflux disease)   ? Hypercholesteremia   ? Hypertension   ? S/P endoscopy July 21, 2010  ? Dr. Gala Romney: limited endoscopy, secondary to food impaction, Schatzki's ring seen  ? ? ?Past Surgical History:  ?Procedure Laterality Date  ? BIOPSY  06/28/2020  ? Procedure: BIOPSY;  Surgeon: Harvel Quale, MD;  [facility];  Service: Gastroenterology;;  ? COLONOSCOPY N/A 03/22/2016  ? Procedure: COLONOSCOPY;  Surgeon: Rogene Houston, MD;  [facility];  Service: Endoscopy;  Laterality: N/A;  830  ? COLONOSCOPY N/A 07/08/2019  ? Procedure: COLONOSCOPY;  Surgeon: Rogene Houston, MD;  [facility];  Service: Endoscopy;  Laterality: N/A;  1200  ? ESOPHAGOGASTRODUODENOSCOPY (EGD) WITH PROPOFOL N/A 06/28/2020  ? Procedure: ESOPHAGOGASTRODUODENOSCOPY (EGD) WITH PROPOFOL;  Surgeon: Harvel Quale, MD;  [facility];  Service: Gastroenterology;  Laterality: N/A;  PM  ? lipoma removal from posterior neck     ? ? ?Family History  ?Problem Relation Age of Onset  ? Bone cancer Mother   ?     deceased in late 68s  ? Aneurysm Father   ?     deceased late 65s, brain  ? Cancer Brother   ? COPD Sister   ? Colon cancer Neg Hx   ? ? ?Social History  ? ?Tobacco Use  ? Smoking status: Former  ?  Packs/day: 0.50  ?  Types: Cigarettes  ?  Quit date: 2019  ?  Years since quitting: 4.2  ? Smokeless tobacco: Former  ?  Quit date: 03/11/2010  ?Vaping Use  ? Vaping Use: Never used  ?Substance Use Topics  ? Alcohol use: Not Currently  ?  Comment: 6 pack a week sometime a couple of shots  ? Drug use: Yes  ?  Frequency: 2.0 times per week  ?  Types: Marijuana  ?  Comment: occ  ? ? ?Medications: I have reviewed the patient's current medications. ?Allergies as of 07/04/2021   ? ?   Reactions  ? American Cockroach Itching  ? Chocolate Other (See Comments)  ? reflux  ? Corn-containing Products Itching  ? Dust Mite Extract Itching  ? Grass Pollen(k-o-r-t-swt Vern) Itching  ? Onion Itching  ? Other   ? Squash and Tomatoes cause itching and whelps.   ? Peanut-containing Drug Products Other (See Comments)  ? Itching and Whelps  ? Pollen Extract-tree Extract [pollen Extract] Itching  ? ?  ? ?  ?  Medication List  ?  ? ?  ? Accurate as of July 04, 2021 10:15 AM. If you have any questions, ask your nurse or doctor.  ?  ?  ? ?  ? ?albuterol 108 (90 Base) MCG/ACT inhaler ?Commonly known as: VENTOLIN HFA ?Inhale 1 puff into the lungs every 6 (six) hours as needed for wheezing or shortness of breath. ?  ?albuterol (2.5 MG/3ML) 0.083% nebulizer solution ?Commonly known as: PROVENTIL ?Take 3 mLs (2.5 mg total) by nebulization every 6 (six) hours as needed. For shortness of breath and/or wheezing ?  ?amLODipine 5 MG tablet ?Commonly known as: NORVASC ?Take 5 mg by mouth daily. ?  ?atorvastatin 20 MG tablet ?Commonly known as: LIPITOR ?Take 20 mg by mouth daily. ?  ?azelastine 0.1 % nasal spray ?Commonly known as: ASTELIN ?Place 1 spray into both nostrils 2  (two) times daily. Use in each nostril as directed ?  ?levocetirizine 5 MG tablet ?Commonly known as: XYZAL ?Take 5 mg by mouth every evening. ?  ?omeprazole 40 MG capsule ?Commonly known as: PRILOSEC ?Take 1 capsule (40 mg total) by mouth every 12 (twelve) hours. ?  ?OVER THE COUNTER MEDICATION ?Allergy shots two shots Q Wednesday per patient for allergy to grass, cockroaches,dust mites, and trees. ?  ?sildenafil 50 MG tablet ?Commonly known as: VIAGRA ?Take 50 mg by mouth daily as needed for erectile dysfunction. ?  ?traMADol 50 MG tablet ?Commonly known as: ULTRAM ?Take 1 tablet (50 mg total) by mouth every 6 (six) hours as needed. ?  ?Trelegy Ellipta 200-62.5-25 MCG/ACT Aepb ?Generic drug: Fluticasone-Umeclidin-Vilant ?Inhale 1 puff into the lungs daily. ?  ? ?  ? ? ? ?ROS:  ?Constitutional: negative for chills, fatigue, and fevers ?Eyes: negative for visual disturbance and pain ?Ears, nose, mouth, throat, and face: positive for sinus problems, negative for ear drainage and sore throat ?Respiratory: positive for shortness of breath, negative for cough and wheezing ?Cardiovascular: negative for chest pain and palpitations ?Gastrointestinal: positive for reflux symptoms, negative for abdominal pain, nausea, and vomiting ?Genitourinary:negative for dysuria, frequency, and urinary retention ?Integument/breast: negative for dryness and rash ?Hematologic/lymphatic: negative for bleeding and lymphadenopathy ?Musculoskeletal:negative for back pain, neck pain, and joint pain ?Neurological: negative for dizziness, tremors, and numbness ?Endocrine: negative for temperature intolerance ? ?Blood pressure (!) 152/91, pulse 64, temperature 98.4 ?F (36.9 ?C), temperature source Oral, resp. rate 14, height '5\' 9"'$  (1.753 m), weight 135 lb (61.2 kg), SpO2 98 %. ?Physical Exam ?Vitals reviewed.  ?Constitutional:   ?   Appearance: Normal appearance.  ?HENT:  ?   Head: Normocephalic and atraumatic.  ?   Ears:  ?   Comments: Right ear  with less than 1 cm cyst of the posterior aspect, nontender, no erythema, not indurated ?Eyes:  ?   Extraocular Movements: Extraocular movements intact.  ?   Pupils: Pupils are equal, round, and reactive to light.  ?Cardiovascular:  ?   Rate and Rhythm: Normal rate and regular rhythm.  ?Pulmonary:  ?   Effort: Pulmonary effort is normal.  ?   Breath sounds: Normal breath sounds.  ?Abdominal:  ?   General: There is no distension.  ?   Palpations: Abdomen is soft.  ?   Tenderness: There is no abdominal tenderness.  ?Musculoskeletal:     ?   General: Normal range of motion.  ?   Cervical back: Normal range of motion.  ?Skin: ?   General: Skin is warm and dry.  ?Neurological:  ?  General: No focal deficit present.  ?   Mental Status: He is alert and oriented to person, place, and time.  ?Psychiatric:     ?   Mood and Affect: Mood normal.     ?   Behavior: Behavior normal.  ? ? ?Results: ?No results found for this or any previous visit (from the past 48 hour(s)). ? ?No results found. ? ? ?Assessment & Plan:  ?[name] is a 66 y.o. male who presents for evaluation of right posterior ear cyst. ? ?-The risk and benefits of excision of right posterior ear cyst were discussed, including but not limited to bleeding, infection, need for additional procedures, and altered position of ear cartilage.  After careful consideration, very Desilets has decided to proceed with this procedure ?-Patient tentatively scheduled for surgery on 4/26 ?-Information provided to the patient regarding sebaceous cysts ? ?All questions were answered to the satisfaction of the patient and family. ? ?Graciella Freer, DO ?[facility] ?[place], [place] [address] ?[phone] (office) ? ? ? ? ? ?

## 2021-07-06 NOTE — H&P (Signed)
[facility] History and Physical ? ?Reason for Referral: Right ear cyst ?Referring Physician: Abran Richard, MD ? ?Chief Complaint   ?New Patient (Initial Visit) ?  ? ? ?[name] is a 66 y.o. male.  ?HPI: Patient presents for evaluation of right ear cyst.  It has been present for 7 to 8 months.  The patient denies any pain or drainage associated with it.  He did try to pop it once, with only a minimal amount of white drainage.  He has a history of sebaceous cyst, with an excision of a posterior neck sebaceous cyst previously performed.  He otherwise has had no other surgeries.  His past medical history significant for hypertension, allergies, and GERD.  He denies use of blood thinning medications.  He occasionally will drink alcohol and smokes marijuana daily.  He denies use of tobacco products. ? ?Past Medical History:  ?Diagnosis Date  ? Asthma   ? Bronchitis   ? COPD (chronic obstructive pulmonary disease) ([place])   ? GERD (gastroesophageal reflux disease)   ? Hypercholesteremia   ? Hypertension   ? S/P endoscopy July 21, 2010  ? Dr. Gala Romney: limited endoscopy, secondary to food impaction, Schatzki's ring seen  ? ? ?Past Surgical History:  ?Procedure Laterality Date  ? BIOPSY  06/28/2020  ? Procedure: BIOPSY;  Surgeon: Harvel Quale, MD;  [facility];  Service: Gastroenterology;;  ? COLONOSCOPY N/A 03/22/2016  ? Procedure: COLONOSCOPY;  Surgeon: Rogene Houston, MD;  [facility];  Service: Endoscopy;  Laterality: N/A;  830  ? COLONOSCOPY N/A 07/08/2019  ? Procedure: COLONOSCOPY;  Surgeon: Rogene Houston, MD;  [facility];  Service: Endoscopy;  Laterality: N/A;  1200  ? ESOPHAGOGASTRODUODENOSCOPY (EGD) WITH PROPOFOL N/A 06/28/2020  ? Procedure: ESOPHAGOGASTRODUODENOSCOPY (EGD) WITH PROPOFOL;  Surgeon: Harvel Quale, MD;  [facility];  Service: Gastroenterology;  Laterality: N/A;  PM  ? lipoma removal from posterior neck     ? ? ?Family History  ?Problem Relation Age of Onset  ? Bone cancer Mother   ?     deceased in late 30s  ? Aneurysm Father   ?     deceased late 81s, brain  ? Cancer Brother   ? COPD Sister   ? Colon cancer Neg Hx   ? ? ?Social History  ? ?Tobacco Use  ? Smoking status: Former  ?  Packs/day: 0.50  ?  Types: Cigarettes  ?  Quit date: 2019  ?  Years since quitting: 4.2  ? Smokeless tobacco: Former  ?  Quit date: 03/11/2010  ?Vaping Use  ? Vaping Use: Never used  ?Substance Use Topics  ? Alcohol use: Not Currently  ?  Comment: 6 pack a week sometime a couple of shots  ? Drug use: Yes  ?  Frequency: 2.0 times per week  ?  Types: Marijuana  ?  Comment: occ  ? ? ?Medications: I have reviewed the patient's current medications. ?Allergies as of 07/04/2021   ? ?   Reactions  ? American Cockroach Itching  ? Chocolate Other (See Comments)  ? reflux  ? Corn-containing Products Itching  ? Dust Mite Extract Itching  ? Grass Pollen(k-o-r-t-swt Vern) Itching  ? Onion Itching  ? Other   ? Squash and Tomatoes cause itching and whelps.   ? Peanut-containing Drug Products Other (See Comments)  ? Itching and Whelps  ? Pollen Extract-tree Extract [pollen Extract] Itching  ? ?  ? ?  ?  Medication List  ?  ? ?  ? Accurate as of July 04, 2021 10:15 AM. If you have any questions, ask your nurse or doctor.  ?  ?  ? ?  ? ?albuterol 108 (90 Base) MCG/ACT inhaler ?Commonly known as: VENTOLIN HFA ?Inhale 1 puff into the lungs every 6 (six) hours as needed for wheezing or shortness of breath. ?  ?albuterol (2.5 MG/3ML) 0.083% nebulizer solution ?Commonly known as: PROVENTIL ?Take 3 mLs (2.5 mg total) by nebulization every 6 (six) hours as needed. For shortness of breath and/or wheezing ?  ?amLODipine 5 MG tablet ?Commonly known as: NORVASC ?Take 5 mg by mouth daily. ?  ?atorvastatin 20 MG tablet ?Commonly known as: LIPITOR ?Take 20 mg by mouth daily. ?  ?azelastine 0.1 % nasal spray ?Commonly known as: ASTELIN ?Place 1 spray into both nostrils 2  (two) times daily. Use in each nostril as directed ?  ?levocetirizine 5 MG tablet ?Commonly known as: XYZAL ?Take 5 mg by mouth every evening. ?  ?omeprazole 40 MG capsule ?Commonly known as: PRILOSEC ?Take 1 capsule (40 mg total) by mouth every 12 (twelve) hours. ?  ?OVER THE COUNTER MEDICATION ?Allergy shots two shots Q Wednesday per patient for allergy to grass, cockroaches,dust mites, and trees. ?  ?sildenafil 50 MG tablet ?Commonly known as: VIAGRA ?Take 50 mg by mouth daily as needed for erectile dysfunction. ?  ?traMADol 50 MG tablet ?Commonly known as: ULTRAM ?Take 1 tablet (50 mg total) by mouth every 6 (six) hours as needed. ?  ?Trelegy Ellipta 200-62.5-25 MCG/ACT Aepb ?Generic drug: Fluticasone-Umeclidin-Vilant ?Inhale 1 puff into the lungs daily. ?  ? ?  ? ? ? ?ROS:  ?Constitutional: negative for chills, fatigue, and fevers ?Eyes: negative for visual disturbance and pain ?Ears, nose, mouth, throat, and face: positive for sinus problems, negative for ear drainage and sore throat ?Respiratory: positive for shortness of breath, negative for cough and wheezing ?Cardiovascular: negative for chest pain and palpitations ?Gastrointestinal: positive for reflux symptoms, negative for abdominal pain, nausea, and vomiting ?Genitourinary:negative for dysuria, frequency, and urinary retention ?Integument/breast: negative for dryness and rash ?Hematologic/lymphatic: negative for bleeding and lymphadenopathy ?Musculoskeletal:negative for back pain, neck pain, and joint pain ?Neurological: negative for dizziness, tremors, and numbness ?Endocrine: negative for temperature intolerance ? ?Blood pressure (!) 152/91, pulse 64, temperature 98.4 ?F (36.9 ?C), temperature source Oral, resp. rate 14, height '5\' 9"'$  (1.753 m), weight 135 lb (61.2 kg), SpO2 98 %. ?Physical Exam ?Vitals reviewed.  ?Constitutional:   ?   Appearance: Normal appearance.  ?HENT:  ?   Head: Normocephalic and atraumatic.  ?   Ears:  ?   Comments: Right ear  with less than 1 cm cyst of the posterior aspect, nontender, no erythema, not indurated ?Eyes:  ?   Extraocular Movements: Extraocular movements intact.  ?   Pupils: Pupils are equal, round, and reactive to light.  ?Cardiovascular:  ?   Rate and Rhythm: Normal rate and regular rhythm.  ?Pulmonary:  ?   Effort: Pulmonary effort is normal.  ?   Breath sounds: Normal breath sounds.  ?Abdominal:  ?   General: There is no distension.  ?   Palpations: Abdomen is soft.  ?   Tenderness: There is no abdominal tenderness.  ?Musculoskeletal:     ?   General: Normal range of motion.  ?   Cervical back: Normal range of motion.  ?Skin: ?   General: Skin is warm and dry.  ?Neurological:  ?  General: No focal deficit present.  ?   Mental Status: He is alert and oriented to person, place, and time.  ?Psychiatric:     ?   Mood and Affect: Mood normal.     ?   Behavior: Behavior normal.  ? ? ?Results: ?No results found for this or any previous visit (from the past 48 hour(s)). ? ?No results found. ? ? ?Assessment & Plan:  ?[name] is a 66 y.o. male who presents for evaluation of right posterior ear cyst. ? ?-The risk and benefits of excision of right posterior ear cyst were discussed, including but not limited to bleeding, infection, need for additional procedures, and altered position of ear cartilage.  After careful consideration, very Harner has decided to proceed with this procedure ?-Patient tentatively scheduled for surgery on 4/26 ?-Information provided to the patient regarding sebaceous cysts ? ?All questions were answered to the satisfaction of the patient and family. ? ?Graciella Freer, DO ?[facility] ?[place], [place] [address] ?[phone] (office) ?

## 2021-07-17 NOTE — Patient Instructions (Signed)
? Your procedure is scheduled on: 07/19/2021 ? Report to [facility] Entrance at  10:15   AM. ? Call this number if you have problems the morning of surgery: [phone] ? ? Remember: ? ? Do not Eat or Drink after midnight  ? ?      No Smoking the morning of surgery ? : ? Take these medicines the morning of surgery with A SIP OF WATER: Amlodipine, omeprazole ?            (Xyzal  and use inhalers if needed) ? ? Do not wear jewelry, make-up or nail polish. ? Do not wear lotions, powders, or perfumes. You may wear deodorant. ? Do not shave 48 hours prior to surgery. Men may shave face and neck. ? Do not bring valuables to the hospital. ? Contacts, dentures or bridgework may not be worn into surgery. ? Leave suitcase in the car. After surgery it may be brought to your room. ? For patients admitted to the hospital, checkout time is 11:00 AM the day of discharge. ? ? Patients discharged the day of surgery will not be allowed to drive home. ?  ? Special Instructions: Shower using CHG night before surgery and shower the day of surgery use CHG.  Use special wash - you have one bottle of CHG for all showers.  You should use approximately 1/2 of the bottle for each shower.  ?How to Use Chlorhexidine for Bathing ?Chlorhexidine gluconate (CHG) is a germ-killing (antiseptic) solution that is used to clean the skin. It can get rid of the bacteria that normally live on the skin and can keep them away for about 24 hours. To clean your skin with CHG, you may be given: ?A CHG solution to use in the shower or as part of a sponge bath. ?A prepackaged cloth that contains CHG. ?Cleaning your skin with CHG may help lower the risk for infection: ?While you are staying in the intensive care unit of the hospital. ?If you have a vascular access, such as a central line, to provide short-term or [place]-term access to your veins. ?If you have a catheter to drain urine from your bladder. ?If you are on a ventilator. A ventilator is a machine  that helps you breathe by moving air in and out of your lungs. ?After surgery. ?What are the risks? ?Risks of using CHG include: ?A skin reaction. ?Hearing loss, if CHG gets in your ears and you have a perforated eardrum. ?Eye injury, if CHG gets in your eyes and is not rinsed out. ?The CHG product catching fire. ?Make sure that you avoid smoking and flames after applying CHG to your skin. ?Do not use CHG: ?If you have a chlorhexidine allergy or have previously reacted to chlorhexidine. ?On babies younger than 103 months of age. ?How to use CHG solution ?Use CHG only as told by your health care provider, and follow the instructions on the label. ?Use the full amount of CHG as directed. Usually, this is one bottle. ?During a shower ?Follow these steps when using CHG solution during a shower (unless your health care provider gives you different instructions): ?Start the shower. ?Use your normal soap and shampoo to wash your face and hair. ?Turn off the shower or move out of the shower stream. ?Pour the CHG onto a clean washcloth. Do not use any type of brush or rough-edged sponge. ?Starting at your neck, lather your body down to your toes. Make sure you follow these instructions: ?If you  will be having surgery, pay special attention to the part of your body where you will be having surgery. Scrub this area for at least 1 minute. ?Do not use CHG on your head or face. If the solution gets into your ears or eyes, rinse them well with water. ?Avoid your genital area. ?Avoid any areas of skin that have broken skin, cuts, or scrapes. ?Scrub your back and under your arms. Make sure to wash skin folds. ?Let the lather sit on your skin for 1-2 minutes or as [place] as told by your health care provider. ?Thoroughly rinse your entire body in the shower. Make sure that all body creases and crevices are rinsed well. ?Dry off with a clean towel. Do not put any substances on your body afterward--such as powder, lotion, or perfume--unless  you are told to do so by your health care provider. Only use lotions that are recommended by the manufacturer. ?Put on clean clothes or pajamas. ?If it is the night before your surgery, sleep in clean sheets. ? ?During a sponge bath ?Follow these steps when using CHG solution during a sponge bath (unless your health care provider gives you different instructions): ?Use your normal soap and shampoo to wash your face and hair. ?Pour the CHG onto a clean washcloth. ?Starting at your neck, lather your body down to your toes. Make sure you follow these instructions: ?If you will be having surgery, pay special attention to the part of your body where you will be having surgery. Scrub this area for at least 1 minute. ?Do not use CHG on your head or face. If the solution gets into your ears or eyes, rinse them well with water. ?Avoid your genital area. ?Avoid any areas of skin that have broken skin, cuts, or scrapes. ?Scrub your back and under your arms. Make sure to wash skin folds. ?Let the lather sit on your skin for 1-2 minutes or as Mill[place] as told by your health care provider. ?Using a different clean, wet washcloth, thoroughly rinse your entire body. Make sure that all body creases and crevices are rinsed well. ?Dry off with a clean towel. Do not put any substances on your body afterward--such as powder, lotion, or perfume--unless you are told to do so by your health care provider. Only use lotions that are recommended by the manufacturer. ?Put on clean clothes or pajamas. ?If it is the night before your surgery, sleep in clean sheets. ?How to use CHG prepackaged cloths ?Only use CHG cloths as told by your health care provider, and follow the instructions on the label. ?Use the CHG cloth on clean, dry skin. ?Do not use the CHG cloth on your head or face unless your health care provider tells you to. ?When washing with the CHG cloth: ?Avoid your genital area. ?Avoid any areas of skin that have broken skin, cuts, or  scrapes. ?Before surgery ?Follow these steps when using a CHG cloth to clean before surgery (unless your health care provider gives you different instructions): ?Using the CHG cloth, vigorously scrub the part of your body where you will be having surgery. Scrub using a back-and-forth motion for 3 minutes. The area on your body should be completely wet with CHG when you are done scrubbing. ?Do not rinse. Discard the cloth and let the area air-dry. Do not put any substances on the area afterward, such as powder, lotion, or perfume. ?Put on clean clothes or pajamas. ?If it is the night before your surgery, sleep in clean  sheets. ? ?For general bathing ?Follow these steps when using CHG cloths for general bathing (unless your health care provider gives you different instructions). ?Use a separate CHG cloth for each area of your body. Make sure you wash between any folds of skin and between your fingers and toes. Wash your body in the following order, switching to a new cloth after each step: ?The front of your neck, shoulders, and chest. ?Both of your arms, under your arms, and your hands. ?Your stomach and groin area, avoiding the genitals. ?Your right leg and foot. ?Your left leg and foot. ?The back of your neck, your back, and your buttocks. ?Do not rinse. Discard the cloth and let the area air-dry. Do not put any substances on your body afterward--such as powder, lotion, or perfume--unless you are told to do so by your health care provider. Only use lotions that are recommended by the manufacturer. ?Put on clean clothes or pajamas. ?Contact a health care provider if: ?Your skin gets irritated after scrubbing. ?You have questions about using your solution or cloth. ?You swallow any chlorhexidine. Call your local poison control center (1-561-243-8180 in the [place] ?Get help right away if: ?Your eyes itch badly, or they become very red or swollen. ?Your skin itches badly and is red or swollen. ?Your hearing  changes. ?You have trouble seeing. ?You have swelling or tingling in your mouth or throat. ?You have trouble breathing. ?These symptoms may represent a serious problem that is an emergency. Do not wait to see if the s

## 2021-07-18 ENCOUNTER — Encounter (HOSPITAL_COMMUNITY)
Admission: RE | Admit: 2021-07-18 | Discharge: 2021-07-18 | Disposition: A | Discharge status: Home / Self Care | Payer: Medicare HMO | Source: Ambulatory Visit | Attending: Surgery | Admitting: Surgery

## 2021-07-18 ENCOUNTER — Other Ambulatory Visit: Payer: Self-pay

## 2021-07-18 DIAGNOSIS — Z01810 Encounter for preprocedural cardiovascular examination: Secondary | ICD-10-CM | POA: Insufficient documentation

## 2021-07-19 ENCOUNTER — Encounter (HOSPITAL_COMMUNITY)
Admission: RE | Disposition: A | Discharge status: Home / Self Care | Payer: Self-pay | Source: Home / Self Care | Attending: Surgery

## 2021-07-19 ENCOUNTER — Ambulatory Visit (HOSPITAL_COMMUNITY)
Admission: RE | Admit: 2021-07-19 | Discharge: 2021-07-19 | Disposition: A | Discharge status: Home / Self Care | Payer: Medicare HMO | Attending: Surgery | Admitting: Surgery

## 2021-07-19 ENCOUNTER — Encounter (HOSPITAL_COMMUNITY): Payer: Self-pay | Admitting: Surgery

## 2021-07-19 ENCOUNTER — Ambulatory Visit (HOSPITAL_COMMUNITY): Payer: Medicare HMO | Admitting: Anesthesiology

## 2021-07-19 ENCOUNTER — Other Ambulatory Visit: Payer: Self-pay

## 2021-07-19 ENCOUNTER — Ambulatory Visit (HOSPITAL_BASED_OUTPATIENT_CLINIC_OR_DEPARTMENT_OTHER): Payer: Medicare HMO | Admitting: Anesthesiology

## 2021-07-19 DIAGNOSIS — J449 Chronic obstructive pulmonary disease, unspecified: Secondary | ICD-10-CM | POA: Insufficient documentation

## 2021-07-19 DIAGNOSIS — I10 Essential (primary) hypertension: Secondary | ICD-10-CM | POA: Insufficient documentation

## 2021-07-19 DIAGNOSIS — F1290 Cannabis use, unspecified, uncomplicated: Secondary | ICD-10-CM | POA: Diagnosis not present

## 2021-07-19 DIAGNOSIS — L723 Sebaceous cyst: Secondary | ICD-10-CM | POA: Diagnosis not present

## 2021-07-19 DIAGNOSIS — Z87891 Personal history of nicotine dependence: Secondary | ICD-10-CM | POA: Insufficient documentation

## 2021-07-19 DIAGNOSIS — L720 Epidermal cyst: Secondary | ICD-10-CM | POA: Insufficient documentation

## 2021-07-19 DIAGNOSIS — D2321 Other benign neoplasm of skin of right ear and external auricular canal: Secondary | ICD-10-CM | POA: Diagnosis not present

## 2021-07-19 DIAGNOSIS — K219 Gastro-esophageal reflux disease without esophagitis: Secondary | ICD-10-CM | POA: Diagnosis not present

## 2021-07-19 HISTORY — PX: MASS EXCISION: SHX2000

## 2021-07-19 SURGERY — EXCISION MASS
Anesthesia: General | Site: Ear | Laterality: Right

## 2021-07-19 MED ORDER — MIDAZOLAM HCL 2 MG/2ML IJ SOLN
INTRAMUSCULAR | Status: DC | PRN
  Administered 2021-07-19: 2 mg via INTRAVENOUS

## 2021-07-19 MED ORDER — DEXAMETHASONE SODIUM PHOSPHATE 10 MG/ML IJ SOLN
INTRAMUSCULAR | Status: AC
  Filled 2021-07-19: qty 1

## 2021-07-19 MED ORDER — FENTANYL CITRATE (PF) 250 MCG/5ML IJ SOLN
INTRAMUSCULAR | Status: DC | PRN
  Administered 2021-07-19 (×4): 25 ug via INTRAVENOUS

## 2021-07-19 MED ORDER — BACITRACIN-NEOMYCIN-POLYMYXIN 400-5-5000 EX OINT: 1.0000 "application " | TOPICAL_OINTMENT | Freq: Every day | CUTANEOUS | 0 refills | Status: DC

## 2021-07-19 MED ORDER — CHLORHEXIDINE GLUCONATE CLOTH 2 % EX PADS: 6.0000 | MEDICATED_PAD | Freq: Once | CUTANEOUS | Status: DC

## 2021-07-19 MED ORDER — OXYCODONE HCL 5 MG PO TABS: 5.0000 mg | ORAL_TABLET | Freq: Three times a day (TID) | ORAL | 0 refills | Status: DC | PRN

## 2021-07-19 MED ORDER — BUPIVACAINE HCL (PF) 0.5 % IJ SOLN
INTRAMUSCULAR | Status: DC | PRN
Start: 2021-07-19 — End: 2021-07-19
  Administered 2021-07-19: .5 mL

## 2021-07-19 MED ORDER — MIDAZOLAM HCL 2 MG/2ML IJ SOLN
INTRAMUSCULAR | Status: AC
  Filled 2021-07-19: qty 2

## 2021-07-19 MED ORDER — DOCUSATE SODIUM 100 MG PO CAPS: 100.0000 mg | ORAL_CAPSULE | Freq: Two times a day (BID) | ORAL | 2 refills | Status: AC

## 2021-07-19 MED ORDER — CHLORHEXIDINE GLUCONATE 0.12 % MT SOLN
15.0000 mL | Freq: Once | OROMUCOSAL | Status: AC
  Administered 2021-07-19: 15 mL via OROMUCOSAL

## 2021-07-19 MED ORDER — LACTATED RINGERS IV SOLN: INTRAVENOUS | Status: DC

## 2021-07-19 MED ORDER — ONDANSETRON HCL 4 MG/2ML IJ SOLN: 4.0000 mg | Freq: Once | INTRAMUSCULAR | Status: DC | PRN

## 2021-07-19 MED ORDER — BACITRACIN ZINC 500 UNIT/GM EX OINT
TOPICAL_OINTMENT | CUTANEOUS | Status: AC
  Filled 2021-07-19: qty 0.9

## 2021-07-19 MED ORDER — ORAL CARE MOUTH RINSE: 15.0000 mL | Freq: Once | OROMUCOSAL | Status: AC

## 2021-07-19 MED ORDER — ONDANSETRON HCL 4 MG/2ML IJ SOLN
INTRAMUSCULAR | Status: DC | PRN
  Administered 2021-07-19: 4 mg via INTRAVENOUS

## 2021-07-19 MED ORDER — LIDOCAINE HCL (CARDIAC) PF 100 MG/5ML IV SOSY
PREFILLED_SYRINGE | INTRAVENOUS | Status: DC | PRN
  Administered 2021-07-19: 60 mg via INTRAVENOUS

## 2021-07-19 MED ORDER — 0.9 % SODIUM CHLORIDE (POUR BTL) OPTIME
TOPICAL | Status: DC | PRN
  Administered 2021-07-19: 1000 mL

## 2021-07-19 MED ORDER — LIDOCAINE HCL (PF) 2 % IJ SOLN
INTRAMUSCULAR | Status: AC
  Filled 2021-07-19: qty 5

## 2021-07-19 MED ORDER — DEXAMETHASONE SODIUM PHOSPHATE 10 MG/ML IJ SOLN
INTRAMUSCULAR | Status: DC | PRN
  Administered 2021-07-19: 5 mg via INTRAVENOUS

## 2021-07-19 MED ORDER — EPHEDRINE SULFATE (PRESSORS) 50 MG/ML IJ SOLN
INTRAMUSCULAR | Status: DC | PRN
Start: 2021-07-19 — End: 2021-07-19
  Administered 2021-07-19 (×2): 10 mg via INTRAVENOUS
  Administered 2021-07-19: 5 mg via INTRAVENOUS

## 2021-07-19 MED ORDER — FENTANYL CITRATE (PF) 100 MCG/2ML IJ SOLN
INTRAMUSCULAR | Status: AC
  Filled 2021-07-19: qty 2

## 2021-07-19 MED ORDER — BACITRACIN ZINC 500 UNIT/GM EX OINT
TOPICAL_OINTMENT | CUTANEOUS | Status: DC | PRN
  Administered 2021-07-19: 1 via TOPICAL

## 2021-07-19 MED ORDER — ONDANSETRON HCL 4 MG/2ML IJ SOLN
INTRAMUSCULAR | Status: AC
  Filled 2021-07-19: qty 2

## 2021-07-19 MED ORDER — BUPIVACAINE HCL (PF) 0.5 % IJ SOLN
INTRAMUSCULAR | Status: AC
  Filled 2021-07-19: qty 30

## 2021-07-19 MED ORDER — SEVOFLURANE IN SOLN
RESPIRATORY_TRACT | Status: AC
Start: 2021-07-19 — End: ?
  Filled 2021-07-19: qty 250

## 2021-07-19 MED ORDER — PROPOFOL 10 MG/ML IV BOLUS
INTRAVENOUS | Status: DC | PRN
  Administered 2021-07-19: 160 mg via INTRAVENOUS

## 2021-07-19 MED ORDER — ACETAMINOPHEN 500 MG PO TABS: 1000.0000 mg | ORAL_TABLET | Freq: Four times a day (QID) | ORAL | 0 refills | Status: AC

## 2021-07-19 MED ORDER — HYDROMORPHONE HCL 1 MG/ML IJ SOLN: 0.2500 mg | INTRAMUSCULAR | Status: DC | PRN

## 2021-07-19 SURGICAL SUPPLY — 35 items
ADH SKN CLS APL DERMABOND .7 (GAUZE/BANDAGES/DRESSINGS) ×1
APL PRP STRL LF ISPRP CHG 10.5 (MISCELLANEOUS) ×1
APPLICATOR CHLORAPREP 10.5 ORG (MISCELLANEOUS) ×2 IMPLANT
BLADE MINI RND TIP GREEN BEAV (BLADE) ×1 IMPLANT
CLOTH BEACON ORANGE TIMEOUT ST (SAFETY) ×2 IMPLANT
COVER LIGHT HANDLE STERIS (MISCELLANEOUS) ×4 IMPLANT
DECANTER SPIKE VIAL GLASS SM (MISCELLANEOUS) ×2 IMPLANT
DERMABOND ADVANCED (GAUZE/BANDAGES/DRESSINGS) ×1
DERMABOND ADVANCED .7 DNX12 (GAUZE/BANDAGES/DRESSINGS) IMPLANT
DRAPE EENT ADH APERT 31X51 STR (DRAPES) ×1 IMPLANT
ELECT NDL TIP 2.8 STRL (NEEDLE) IMPLANT
ELECT NEEDLE TIP 2.8 STRL (NEEDLE) ×2 IMPLANT
ELECT REM PT RETURN 9FT ADLT (ELECTROSURGICAL) ×2
ELECTRODE REM PT RTRN 9FT ADLT (ELECTROSURGICAL) ×1 IMPLANT
GLOVE BIOGEL PI IND STRL 6.5 (GLOVE) ×1 IMPLANT
GLOVE BIOGEL PI IND STRL 7.0 (GLOVE) ×2 IMPLANT
GLOVE BIOGEL PI INDICATOR 6.5 (GLOVE) ×1
GLOVE BIOGEL PI INDICATOR 7.0 (GLOVE) ×2
GLOVE SURG SS PI 6.5 STRL IVOR (GLOVE) ×4 IMPLANT
GOWN STRL REUS W/TWL LRG LVL3 (GOWN DISPOSABLE) ×4 IMPLANT
KIT TURNOVER KIT A (KITS) ×2 IMPLANT
MANIFOLD NEPTUNE II (INSTRUMENTS) ×2 IMPLANT
NDL HYPO 25X1 1.5 SAFETY (NEEDLE) ×1 IMPLANT
NEEDLE HYPO 25X1 1.5 SAFETY (NEEDLE) ×2 IMPLANT
NS IRRIG 1000ML POUR BTL (IV SOLUTION) ×2 IMPLANT
PACK MINOR (CUSTOM PROCEDURE TRAY) IMPLANT
PAD ARMBOARD 7.5X6 YLW CONV (MISCELLANEOUS) ×2 IMPLANT
PENCIL SMOKE EVACUATOR (MISCELLANEOUS) ×1 IMPLANT
SET BASIN LINEN APH (SET/KITS/TRAYS/PACK) ×2 IMPLANT
SUT ETHILON 3 0 FSL (SUTURE) IMPLANT
SUT MNCRL AB 4-0 PS2 18 (SUTURE) ×2 IMPLANT
SUT PROLENE 5 0 C 1 24 (SUTURE) ×1 IMPLANT
SUT VIC AB 3-0 SH 27 (SUTURE) ×2
SUT VIC AB 3-0 SH 27X BRD (SUTURE) ×1 IMPLANT
SYR CONTROL 10ML LL (SYRINGE) ×2 IMPLANT

## 2021-07-19 NOTE — Discharge Instructions (Signed)
Ambulatory Surgery Discharge Instructions ? ?General Anesthesia or Sedation ?Do not drive or operate heavy machinery for 24 hours.  ?Do not consume alcohol, tranquilizers, sleeping medications, or any non-prescribed medications for 24 hours. ?Do not make important decisions or sign any important papers in the next 24 hours. ?You should have someone with you tonight at home. ? ?Activity ? You are advised to go directly home from the hospital.  Restrict your activities and rest for a day.  Resume light activity tomorrow. No heavy lifting over 10 lbs or strenuous exercise. ? ?Fluids and Diet ?Begin with clear liquids, bouillon, dry toast, soda crackers.  If not nauseated, you may go to a regular diet when you desire.  Greasy and spicy foods are not advised. ? ?Medications ? If you have not had a bowel movement in 24 hours, take 2 tablespoons over the counter Milk of mag. ?            You May resume your blood thinners tomorrow (Aspirin, coumadin, or other).  ?You are being discharged with prescriptions for Opioid/Narcotic Medications: There are some specific considerations for these medications that you should know. ?Opioid Meds have risks & benefits. Addiction to these meds is always a concern with prolonged use ?Take medication only as directed ?Do not drive while taking narcotic pain medication ?Do not crush tablets or capsules ?Do not use a different container than medication was dispensed in ?Lock the container of medication in a cool, dry place out of reach of children and pets. ?Opioid medication can cause addiction ?Do not share with anyone else (this is a felony) ?Do not store medications for future use. Dispose of them properly. ?    Disposal:  ?Find a [employer] household drug take back site near you.  ?If you can't get to a drug take back site, use the recipe below as a last resort to dispose of expired, unused or unwanted drugs. ?Disposal  ?(Do not dispose chemotherapy drugs this way, talk to your  prescribing doctor instead.) Step 1: Mix drugs (do not crush) with dirt, kitty litter, or used coffee grounds and add a small amount of water to dissolve any solid medications. Step 2: Seal drugs in plastic bag. Step 3: Place plastic bag in trash. Step 4: Take prescription container and scratch out personal information, then recycle or throw away. ? ?Operative Site ? You have external stitches on your incision. These will come out in office at your follow up appointment. Ok to Games developer. Keep wound clean and dry. No baths or swimming. No lifting more than 10 pounds. ? ?Contact Information: ?If you have questions or concerns, please call our office, [phone], Monday- Thursday 8AM-5PM and Friday 8AM-12Noon.  ?If it is after hours or on the weekend, please call [facility] Main Number, [phone], and ask to speak to the surgeon on call for Dr. Okey Dupre at [facility].  ? ?SPECIFIC COMPLICATIONS TO WATCH FOR: ?Inability to urinate ?Fever over 101? F by mouth ?Nausea and vomiting lasting longer than 24 hours. ?Pain not relieved by medication ordered ?Swelling around the operative site ?Increased redness, warmth, hardness, around operative area ?Numbness, tingling, or cold fingers or toes ?Blood -soaked dressing, (small amounts of oozing may be normal) ?Increasing and progressive drainage from surgical area or exam site ? ?

## 2021-07-19 NOTE — Op Note (Signed)
SURGICAL OPERATIVE REPORT ? ?DATE OF PROCEDURE: 07/19/2021 ? ?ATTENDING Surgeon(s): ?Arthelia Callicott, Flint Melter, DO ? ?ANESTHESIA: Local ? ?PRE-OPERATIVE DIAGNOSIS: Sebaceous cyst of the right posterior ear without prior infection  ?  ?POST-OPERATIVE DIAGNOSIS: Sebaceous cyst of the right posterior ear without prior infection  ?  ?PROCEDURE(S):  ?1.) Excision of symptomatic sebaceous cyst of the right posterior ear ?  ?INTRAOPERATIVE FINDINGS: 1 cm x 1 cm non-infected subcutaneous mass of the right posterior ear (most likely a sebaceous cyst), removed intact ? ?ESTIMATED BLOOD LOSS: Minimal (< 20 mL) ? ?URINE OUTPUT: No Foley ? ?SPECIMENS: 1 cm x 1 cm non-infected subcutaneous mass of the right posterior ear (most likely a sebaceous cyst), removed intact ? ?IMPLANTS: None ? ?DRAINS: None ? ?COMPLICATIONS: None apparent ? ?CONDITION AT END OF PROCEDURE: Hemodynamically stable and awake ? ?DISPOSITION OF PATIENT: PACU ? ?INDICATIONS FOR PROCEDURE:  ?Patient is a 66 y.o. male who presented for a excision of right posterior ear cyst, which has been increasing in size. Patient was accordingly referred for surgical evaluation and management. All risks, benefits, and alternatives to above procedure were discussed with the patient, all of patient's questions were answered to his expressed satisfaction, and informed consent was obtained and documented. ?  ?DETAILS OF PROCEDURE: ?Patient was brought to the operating suite and was appropriately identified. Laying on the OR table in a supine position with right shoulder bumped and head turned to the left, the operative site was prepped and draped in the usual sterile fashion.  A timeout was performed.  An elliptical incision was made overlying the right posterior ear mass.  Using electrocautery, the dermis was fully incised.  The mass was dissected from the surrounding tissues using scissors.  The mass was removed intact and sent to pathology for evaluation.  The mass measured  1 x 1 cm.  Hemostasis was achieved with electrocautery.  The incision was closed with 5-0 Prolene in a horizontal mattress fashion.  The skin was anesthetized with lidocaine.  Skin was then cleaned and dried, and sterile bacitracin was applied to the wound. Patient was then safely transferred to recovery for post-procedural monitoring and care. ? ?I was present for all aspects of the above procedure, and no operative complications were apparent. ? ? ?Jabir Dahlem, DO ?[facility] ?[place], [place] [address] ?[phone] (office) ? ?

## 2021-07-19 NOTE — Interval H&P Note (Signed)
History and Physical Interval Note: ? ?07/19/2021 ?11:11 AM ? ?[name]  has presented today for surgery, with the diagnosis of LEFT EAR MASS.  The various methods of treatment have been discussed with the patient and family. After consideration of risks, benefits and other options for treatment, the patient has consented to  Procedure(s): ?EXCISION MASS RIGHT POSTERIOR EAR (Right) as a surgical intervention.  The patient's history has been reviewed, patient examined, no change in status, stable for surgery.  I have reviewed the patient's chart and labs.  Questions were answered to the patient's satisfaction.   ? ? ?Samual Beals A Jamy Cleckler ? ? ?

## 2021-07-19 NOTE — Interval H&P Note (Deleted)
History and Physical Interval Note: ? ?07/19/2021 ?11:10 AM ? ?[name]  has presented today for surgery, with the diagnosis of LEFT EAR MASS.  The various methods of treatment have been discussed with the patient and family. After consideration of risks, benefits and other options for treatment, the patient has consented to  Procedure(s): ?EXCISION MASS LEFT POSTERIOR EAR (Right) as a surgical intervention.  The patient's history has been reviewed, patient examined, no change in status, stable for surgery.  I have reviewed the patient's chart and labs.  Questions were answered to the patient's satisfaction.   ? ? ?Marvion Bastidas A Tyneisha Hegeman ? ? ?

## 2021-07-19 NOTE — Progress Notes (Signed)
Update Note: ? ?Spoke with the patient's wife, Demetrious Rainford, in the consultation room.  I explained that we were able to remove the mass without difficulty.  Her husband tolerated the procedure without issue.  He has external stitches, that we will get removed in office.  I will send him home with a prescription for antibiotic ointment, and Roxicodone.  He should take scheduled Tylenol for couple of days and only take the narcotic pain medication for spikes in pain.  If he takes the narcotic pain medication, he should take a stool softener as well.  He may shower tomorrow, and once he is done in the shower, he should cover  the incision site with antibiotic ointment.  All questions were answered to her expressed satisfaction. ? ?Graciella Freer, DO ?[facility] ?[place], [place] [address] ?[phone] (office) ? ?

## 2021-07-19 NOTE — Anesthesia Postprocedure Evaluation (Signed)
Anesthesia Post Note ? ?Patient: [name] ? ?Procedure(s) Performed: EXCISION MASS RIGHT POSTERIOR EAR (Right: Ear) ? ?Patient location during evaluation: Phase II ?Anesthesia Type: General ?Level of consciousness: awake and alert and oriented ?Pain management: pain level controlled ?Vital Signs Assessment: post-procedure vital signs reviewed and stable ?Respiratory status: spontaneous breathing, nonlabored ventilation and respiratory function stable ?Cardiovascular status: blood pressure returned to baseline and stable ?Postop Assessment: no apparent nausea or vomiting ?Anesthetic complications: no ? ? ?No notable events documented. ? ? ?Last Vitals:  ?Vitals:  ? 07/19/21 1228 07/19/21 1237  ?BP: (!) 143/89 (!) 147/92  ?Pulse: 69 64  ?Resp: 11 15  ?Temp:    ?SpO2: 100% 98%  ?  ?Last Pain:  ?Vitals:  ? 07/19/21 1237  ?TempSrc:   ?PainSc: 5   ? ? ?  ?  ?  ?  ?  ?  ? ?Olivia Pavelko C Serrena Linderman ? ? ? ? ?

## 2021-07-19 NOTE — Anesthesia Preprocedure Evaluation (Addendum)
Anesthesia Evaluation  ?Patient identified by MRN, date of birth, ID band ?Patient awake ? ? ? ?Reviewed: ?Allergy & Precautions, NPO status , Patient's Chart, lab work & pertinent test results ? ?Airway ?Mallampati: II ? ?TM Distance: >3 FB ?Neck ROM: Full ? ? ? Dental ? ?(+) Dental Advisory Given, Missing ?  ?Pulmonary ?asthma , COPD, former smoker,  ?  ?Pulmonary exam normal ?breath sounds clear to auscultation ? ? ? ? ? ? Cardiovascular ?Exercise Tolerance: Good ?hypertension, Pt. on medications ?Normal cardiovascular exam ?Rhythm:Regular Rate:Normal ? ? ?  ?Neuro/Psych ?negative neurological ROS ? negative psych ROS  ? GI/Hepatic ?GERD  Medicated and Controlled,(+)  ?  ? substance abuse ? marijuana use,   ?Endo/Other  ?negative endocrine ROS ? Renal/GU ?negative Renal ROS  ?negative genitourinary ?  ?Musculoskeletal ?negative musculoskeletal ROS ?(+)  ? Abdominal ?  ?Peds ?negative pediatric ROS ?(+)  Hematology ? ?(+) Blood dyscrasia, anemia ,   ?Anesthesia Other Findings ? ? Reproductive/Obstetrics ?negative OB ROS ? ?  ? ? ? ? ? ? ? ? ? ? ? ? ? ?  ?  ? ? ? ? ? ? ?Anesthesia Physical ?Anesthesia Plan ? ?ASA: 2 ? ?Anesthesia Plan: General  ? ?Post-op Pain Management: Minimal or no pain anticipated  ? ?Induction: Intravenous ? ?PONV Risk Score and Plan: 2 and Ondansetron ? ?Airway Management Planned: LMA ? ?Additional Equipment:  ? ?Intra-op Plan:  ? ?Post-operative Plan: Extubation in OR ? ?Informed Consent: I have reviewed the patients History and Physical, chart, labs and discussed the procedure including the risks, benefits and alternatives for the proposed anesthesia with the patient or authorized representative who has indicated his/her understanding and acceptance.  ? ? ? ? ? ?Plan Discussed with: CRNA and Surgeon ? ?Anesthesia Plan Comments:   ? ? ? ? ? ?Anesthesia Quick Evaluation ? ?

## 2021-07-19 NOTE — Anesthesia Procedure Notes (Signed)
Procedure Name: LMA Insertion ?Date/Time: 07/19/2021 11:25 AM ?Performed by: Karna Dupes, CRNA ?Pre-anesthesia Checklist: Emergency Drugs available, Patient identified, Suction available and Patient being monitored ?Patient Re-evaluated:Patient Re-evaluated prior to induction ?Oxygen Delivery Method: Circle system utilized ?Preoxygenation: Pre-oxygenation with 100% oxygen ?Induction Type: IV induction ?LMA: LMA inserted ?LMA Size: 4.0 ?Number of attempts: 1 ?Placement Confirmation: positive ETCO2 and breath sounds checked- equal and bilateral ?Tube secured with: Tape ?Dental Injury: Teeth and Oropharynx as per pre-operative assessment  ? ? ? ? ?

## 2021-07-19 NOTE — Transfer of Care (Signed)
Immediate Anesthesia Transfer of Care Note ? ?Patient: [name] ? ?Procedure(s) Performed: EXCISION MASS LEFT POSTERIOR EAR (Right) ? ?Patient Location: PACU ? ?Anesthesia Type:General ? ?Level of Consciousness: drowsy ? ?Airway & Oxygen Therapy: Patient Spontanous Breathing ? ?Post-op Assessment: Report given to RN and Post -op Vital signs reviewed and stable ? ?Post vital signs: Reviewed and stable ? ?Last Vitals:  ?Vitals Value Taken Time  ?BP 149/84   ?Temp    ?Pulse 69   ?Resp 16   ?SpO2 100%   ? ? ?Last Pain:  ?Vitals:  ? 07/19/21 1032  ?TempSrc: Oral  ?PainSc: 0-No pain  ?   ? ?  ? ?Complications: No notable events documented. ?

## 2021-07-20 LAB — SURGICAL PATHOLOGY

## 2021-07-21 ENCOUNTER — Encounter (HOSPITAL_COMMUNITY): Payer: Self-pay | Admitting: Surgery

## 2021-08-01 ENCOUNTER — Ambulatory Visit (INDEPENDENT_AMBULATORY_CARE_PROVIDER_SITE_OTHER): Payer: Medicare HMO | Admitting: Surgery

## 2021-08-01 ENCOUNTER — Encounter: Payer: Self-pay | Admitting: Surgery

## 2021-08-01 VITALS — BP 142/84 | HR 70 | Temp 98.7°F | Resp 16 | Ht 69.0 in | Wt 133.0 lb

## 2021-08-01 DIAGNOSIS — Z09 Encounter for follow-up examination after completed treatment for conditions other than malignant neoplasm: Secondary | ICD-10-CM

## 2021-08-01 NOTE — Progress Notes (Signed)
[facility] Note  ? ?HPI:  ?66 y.o. Male presents to clinic for post-op follow-up right posterior ear mass excision on 4/26.  He has no complaints at this time.  He has been applying antibiotic ointment to the incision site.  He denies any drainage associated with the incision site.  He denies fevers and chills.  He denies any significant pain. ? ?Review of Systems:  ?All other review of systems: otherwise negative  ? ?Vital Signs:  ?BP (!) 142/84   Pulse 70   Temp 98.7 ?F (37.1 ?C) (Oral)   Resp 16   Ht '5\' 9"'$  (1.753 m)   Wt 133 lb (60.3 kg)   SpO2 96%   BMI 19.64 kg/m?   ? ?Physical Exam:  ?Physical Exam ?Vitals reviewed.  ?Constitutional:   ?   Appearance: Normal appearance.  ?HENT:  ?   Ears:  ?   Comments: Right posterior ear with incision site healing well, sutures in place; no drainage, erythema, or induration ?Neurological:  ?   Mental Status: He is alert.  ? ? ?Laboratory studies: None ? ?Imaging:  ?None ? ?Pathology: ?A. SOFT TISSUE, EAR, MASS, POSTERIOR, RIGHT, EXCISION:  ?- Benign epidermal cyst.  ? ?Assessment:  ?66 y.o. yo Male who presents for follow-up status post right posterior ear mass excision. ? ?Plan:  ?-Sutures removed from the incision site ?-Advised the patient to continue antibiotic ointment to the incision for an additional week ?-Pathology results provided to the patient ?-Follow up with me as needed ? ?All of the above recommendations were discussed with the patient, and all of patient's questions were answered to his expressed satisfaction. ? ?Graciella Freer, DO ?[facility] ?[place], [place] [address] ?[phone] (office) ?

## 2021-08-14 ENCOUNTER — Encounter: Payer: Self-pay | Admitting: Urology

## 2021-08-14 ENCOUNTER — Ambulatory Visit (INDEPENDENT_AMBULATORY_CARE_PROVIDER_SITE_OTHER): Payer: Medicare HMO | Admitting: Urology

## 2021-08-14 VITALS — BP 144/96 | HR 57

## 2021-08-14 DIAGNOSIS — N401 Enlarged prostate with lower urinary tract symptoms: Secondary | ICD-10-CM

## 2021-08-14 DIAGNOSIS — N138 Other obstructive and reflux uropathy: Secondary | ICD-10-CM | POA: Diagnosis not present

## 2021-08-14 LAB — MICROSCOPIC EXAMINATION
Bacteria, UA: NONE SEEN
RBC, Urine: NONE SEEN /hpf (ref 0–2)
Renal Epithel, UA: NONE SEEN /hpf

## 2021-08-14 LAB — BLADDER SCAN AMB NON-IMAGING: Scan Result: 57

## 2021-08-14 LAB — URINALYSIS, ROUTINE W REFLEX MICROSCOPIC
Bilirubin, UA: NEGATIVE
Glucose, UA: NEGATIVE
Ketones, UA: NEGATIVE
Nitrite, UA: NEGATIVE
Protein,UA: NEGATIVE
RBC, UA: NEGATIVE
Specific Gravity, UA: 1.025 (ref 1.005–1.030)
Urobilinogen, Ur: 0.2 mg/dL (ref 0.2–1.0)
pH, UA: 6 (ref 5.0–7.5)

## 2021-08-14 NOTE — Progress Notes (Signed)
Assessment: 1. BPH with obstruction/lower urinary tract symptoms     Plan: I discussed diagnosis and management of BPH with lower urinary tract symptoms.  Options for management discussed including observation, medical therapy with alpha blockers, combination therapy with alpha blockers and 5 alpha reductase inhibitors, minimally invasive therapy, and surgical therapy. At the present time, he would like to monitor his symptoms and is not interested in pursuing any treatment. Recommend annual DRE and PSA for prostate cancer surveillance. Return to office in 6 months  Chief Complaint:  Chief Complaint  Patient presents with   Benign Prostatic Hypertrophy    History of Present Illness:  [name] is a 66 y.o. year old male who is seen in consultation from Abran Richard, MD for evaluation of BPH with urinary symptoms.   He presents with intermittent symptoms of hesitancy, intermittent stream, decreased force of stream, and nocturia 1-2 times.  No dysuria or gross hematuria.  He previously tried tamsulosin without improvement in his symptoms.  No history of UTIs or prostatitis. IPSS = 16 today.  PSA from 10/22: 2.4 with 25% free.  Past Medical History:  Past Medical History:  Diagnosis Date   Asthma    Bronchitis    COPD (chronic obstructive pulmonary disease) (HCC)    GERD (gastroesophageal reflux disease)    Hypercholesteremia    Hypertension    S/P endoscopy July 21, 2010   Dr. Gala Romney: limited endoscopy, secondary to food impaction, Schatzki's ring seen    Past Surgical History:  Past Surgical History:  Procedure Laterality Date   BIOPSY  06/28/2020   Procedure: BIOPSY;  Surgeon: Harvel Quale, MD;  Location: [facility];  Service: Gastroenterology;;   COLONOSCOPY N/A 03/22/2016   Procedure: COLONOSCOPY;  Surgeon: Rogene Houston, MD;  Location: [facility];  Service: Endoscopy;  Laterality: N/A;  830   COLONOSCOPY N/A 07/08/2019   Procedure:  COLONOSCOPY;  Surgeon: Rogene Houston, MD;  Location: [facility];  Service: Endoscopy;  Laterality: N/A;  1200   ESOPHAGOGASTRODUODENOSCOPY (EGD) WITH PROPOFOL N/A 06/28/2020   Procedure: ESOPHAGOGASTRODUODENOSCOPY (EGD) WITH PROPOFOL;  Surgeon: Harvel Quale, MD;  Location: [facility];  Service: Gastroenterology;  Laterality: N/A;  PM   lipoma removal from posterior neck     MASS EXCISION Right 07/19/2021   Procedure: EXCISION MASS RIGHT POSTERIOR EAR;  Surgeon: Rusty Aus, DO;  Location: [facility];  Service: General;  Laterality: Right;    Allergies:  Allergies  Allergen Reactions   American Cockroach Itching   Chocolate Other (See Comments)    reflux   Corn-Containing Products Itching   Dust Mite Extract Itching   Grass Pollen(K-O-R-T-Swt Vern) Itching   Onion Itching   Other Itching    Squash and Tomatoes cause itching and whelps.    Peanut-Containing Drug Products Itching    Itching and Whelps   Pollen Extract-Tree Extract [Pollen Extract] Itching    Family History:  Family History  Problem Relation Age of Onset   Bone cancer Mother        deceased in late 58s   Aneurysm Father        deceased late 19s, brain   Cancer Brother    COPD Sister    Colon cancer Neg Hx     Social History:  Social History   Tobacco Use   Smoking status: Former    Packs/day: 0.50    Types: Cigarettes    Quit date: 2019    Years  since quitting: 4.3   Smokeless tobacco: Former    Quit date: 03/11/2010  Vaping Use   Vaping Use: Never used  Substance Use Topics   Alcohol use: Not Currently    Comment: 6 pack a week sometime a couple of shots   Drug use: Yes    Frequency: 2.0 times per week    Types: Marijuana    Comment: occ    Review of symptoms:  Constitutional:  Negative for unexplained weight loss, night sweats, fever, chills ENT:  Negative for nose bleeds, sinus pain, painful swallowing CV:  Negative for chest pain, shortness of breath, exercise  intolerance, palpitations, loss of consciousness Resp:  Negative for cough, wheezing, shortness of breath GI:  Negative for nausea, vomiting, diarrhea, bloody stools GU:  Positives noted in HPI; otherwise negative for gross hematuria, dysuria, urinary incontinence Neuro:  Negative for seizures, poor balance, limb weakness, slurred speech Psych:  Negative for lack of energy, depression, anxiety Endocrine:  Negative for polydipsia, polyuria, symptoms of hypoglycemia (dizziness, hunger, sweating) Hematologic:  Negative for anemia, purpura, petechia, prolonged or excessive bleeding, use of anticoagulants  Allergic:  Negative for difficulty breathing or choking as a result of exposure to anything; no shellfish allergy; no allergic response (rash/itch) to materials, foods  Physical exam: BP (!) 144/96   Pulse (!) 57  GENERAL APPEARANCE:  Well appearing, well developed, well nourished, NAD HEENT: Atraumatic, Normocephalic, oropharynx clear. NECK: Supple without lymphadenopathy or thyromegaly. LUNGS: Clear to auscultation bilaterally. HEART: Regular Rate and Rhythm without murmurs, gallops, or rubs. ABDOMEN: Soft, non-tender, No Masses. EXTREMITIES: Moves all extremities well.  Without clubbing, cyanosis, or edema. NEUROLOGIC:  Alert and oriented x 3, normal gait, CN II-XII grossly intact.  MENTAL STATUS:  Appropriate. BACK:  Non-tender to palpation.  No CVAT SKIN:  Warm, dry and intact.   GU: Penis:  circumcised Meatus: Normal Scrotum: bilateral enlargement consistent with hydroceles Testis: normal without masses bilaterally Prostate: 50 g, NT, no nodules Rectum: Normal tone,  no masses or tenderness   Results: U/A:  0-5 WBC  Results for orders placed or performed in visit on 08/14/21 (from the past 24 hour(s))  BLADDER SCAN AMB NON-IMAGING   Collection Time: 08/14/21 11:11 AM  Result Value Ref Range   Scan Result 57

## 2021-08-16 ENCOUNTER — Ambulatory Visit (HOSPITAL_COMMUNITY)
Admission: RE | Admit: 2021-08-16 | Discharge: 2021-08-16 | Disposition: A | Discharge status: Home / Self Care | Payer: Medicare HMO | Source: Ambulatory Visit | Attending: Internal Medicine | Admitting: Internal Medicine

## 2021-08-16 DIAGNOSIS — Z122 Encounter for screening for malignant neoplasm of respiratory organs: Secondary | ICD-10-CM | POA: Insufficient documentation

## 2021-08-16 DIAGNOSIS — Z87891 Personal history of nicotine dependence: Secondary | ICD-10-CM | POA: Diagnosis not present

## 2022-02-12 ENCOUNTER — Ambulatory Visit: Payer: Medicare HMO | Admitting: Urology

## 2022-02-13 ENCOUNTER — Ambulatory Visit: Payer: Medicare HMO | Admitting: Urology

## 2022-04-09 ENCOUNTER — Ambulatory Visit: Payer: Medicare HMO | Admitting: Urology

## 2022-05-14 ENCOUNTER — Ambulatory Visit: Payer: Medicare HMO | Admitting: Urology

## 2022-06-25 ENCOUNTER — Ambulatory Visit: Payer: Medicare HMO | Admitting: Urology

## 2022-07-09 ENCOUNTER — Ambulatory Visit: Payer: Medicare HMO | Admitting: Urology

## 2022-12-10 ENCOUNTER — Encounter: Payer: Self-pay | Admitting: Urology

## 2022-12-10 ENCOUNTER — Ambulatory Visit (INDEPENDENT_AMBULATORY_CARE_PROVIDER_SITE_OTHER): Payer: Medicare HMO | Admitting: Urology

## 2022-12-10 VITALS — BP 116/71 | HR 66

## 2022-12-10 DIAGNOSIS — R3129 Other microscopic hematuria: Secondary | ICD-10-CM

## 2022-12-10 DIAGNOSIS — R300 Dysuria: Secondary | ICD-10-CM

## 2022-12-10 DIAGNOSIS — N401 Enlarged prostate with lower urinary tract symptoms: Secondary | ICD-10-CM | POA: Diagnosis not present

## 2022-12-10 DIAGNOSIS — R82998 Other abnormal findings in urine: Secondary | ICD-10-CM

## 2022-12-10 DIAGNOSIS — N3000 Acute cystitis without hematuria: Secondary | ICD-10-CM

## 2022-12-10 DIAGNOSIS — R3911 Hesitancy of micturition: Secondary | ICD-10-CM

## 2022-12-10 DIAGNOSIS — N138 Other obstructive and reflux uropathy: Secondary | ICD-10-CM

## 2022-12-10 MED ORDER — TAMSULOSIN HCL 0.4 MG PO CAPS
0.4000 mg | ORAL_CAPSULE | Freq: Every day | ORAL | 11 refills | Status: DC
Start: 2022-12-10 — End: 2023-07-29

## 2022-12-10 MED ORDER — CEPHALEXIN 500 MG PO CAPS: 500.0000 mg | ORAL_CAPSULE | Freq: Three times a day (TID) | ORAL | 0 refills | Status: AC

## 2022-12-10 NOTE — Progress Notes (Unsigned)
12/10/2022 1:08 PM   [name] 21-Jan-1956 [id number]  Referring provider: Alvina Filbert, MD [address] [place],  [place] [address]  No chief complaint on file.   HPI: New pt for me --   1) BPH - c/o intermittent symptoms of hesitancy, intermittent stream, decreased force of stream, and nocturia 1-2 times.  No dysuria or gross hematuria.  He previously tried tamsulosin without improvement in his symptoms.  No history of UTIs or prostatitis. IPSS = 16.    03/22 CT benign, prostate ~ 30 g. PSA from 10/22: 2.4 with 25% free.   Today, seen for the above. He has dysuria, hesistancy and cloudy urine for past 2-3 weeks. No gross hematuria. No fevers.   PVR 84 ml. UA many bacteria, > 30 wbc and 3-10 rbc.   He was in textiles. Made yarn.   PMH: Past Medical History:  Diagnosis Date   Asthma    Bronchitis    COPD (chronic obstructive pulmonary disease) (HCC)    GERD (gastroesophageal reflux disease)    Hypercholesteremia    Hypertension    S/P endoscopy July 21, 2010   Dr. Jena Gauss: limited endoscopy, secondary to food impaction, Schatzki's ring seen    Surgical History: Past Surgical History:  Procedure Laterality Date   BIOPSY  06/28/2020   Procedure: BIOPSY;  Surgeon: Dolores Frame, MD;  [facility];  Service: Gastroenterology;;   COLONOSCOPY N/A 03/22/2016   Procedure: COLONOSCOPY;  Surgeon: Malissa Hippo, MD;  [facility];  Service: Endoscopy;  Laterality: N/A;  830   COLONOSCOPY N/A 07/08/2019   Procedure: COLONOSCOPY;  Surgeon: Malissa Hippo, MD;  [facility];  Service: Endoscopy;  Laterality: N/A;  1200   ESOPHAGOGASTRODUODENOSCOPY (EGD) WITH PROPOFOL N/A 06/28/2020   Procedure: ESOPHAGOGASTRODUODENOSCOPY (EGD) WITH PROPOFOL;  Surgeon: Dolores Frame, MD;  [facility];  Service: Gastroenterology;  Laterality: N/A;  PM   lipoma removal from posterior neck     MASS EXCISION Right 07/19/2021    Procedure: EXCISION MASS RIGHT POSTERIOR EAR;  Surgeon: Lewie Chamber, DO;  [facility]: [facility];  Service: General;  Laterality: Right;    Home Medications:  Allergies as of 12/10/2022       Reactions   American Cockroach Itching   Chocolate Other (See Comments)   reflux   Corn-containing Products Itching   Dust Mite Extract Itching   Grass Pollen(k-o-r-t-swt Vern) Itching   Onion Itching   Other Itching   Squash and Tomatoes cause itching and whelps.    Peanut-containing Drug Products Itching   Itching and Whelps   Pollen Extract-tree Extract [pollen Extract] Itching        Medication List        Accurate as of December 10, 2022  1:08 PM. If you have any questions, ask your nurse or doctor.          albuterol 108 (90 Base) MCG/ACT inhaler Commonly known as: VENTOLIN HFA Inhale 1 puff into the lungs every 6 (six) hours as needed for wheezing or shortness of breath.   albuterol (2.5 MG/3ML) 0.083% nebulizer solution Commonly known as: PROVENTIL Take 3 mLs (2.5 mg total) by nebulization every 6 (six) hours as needed. For shortness of breath and/or wheezing   amLODipine 5 MG tablet Commonly known as: NORVASC Take 5 mg by mouth daily.   azelastine 0.05 % ophthalmic solution Commonly known as: OPTIVAR Place 1 drop into both eyes daily as needed (  allergies).   azelastine 0.1 % nasal spray Commonly known as: ASTELIN Place 1 spray into both nostrils 2 (two) times daily as needed for allergies or rhinitis. Use in each nostril as directed   EPINEPHrine 0.3 mg/0.3 mL Soaj injection Commonly known as: EPI-PEN Inject 0.3 mg into the muscle as needed for anaphylaxis.   levocetirizine 5 MG tablet Commonly known as: XYZAL Take 5 mg by mouth every evening.   neomycin-bacitracin-polymyxin [id number] ointment Commonly known as: NEOSPORIN Apply 1 application. topically daily. Apply to your right ear surgical site daily   omeprazole 20 MG capsule Commonly known as:  PRILOSEC Take 20 mg by mouth 2 (two) times daily as needed (acid reflux).   OVER THE COUNTER MEDICATION Allergy shots two shots Q Wednesday per patient for allergy to grass, cockroaches,dust mites, and trees.   oxyCODONE 5 MG immediate release tablet Commonly known as: Roxicodone Take 1 tablet (5 mg total) by mouth every 8 (eight) hours as needed.   Trelegy Ellipta 200-62.5-25 MCG/INH Aepb Generic drug: Fluticasone-Umeclidin-Vilant Inhale 1 puff into the lungs daily.        Allergies:  Allergies  Allergen Reactions   American Cockroach Itching   Chocolate Other (See Comments)    reflux   Corn-Containing Products Itching   Dust Mite Extract Itching   Grass Pollen(K-O-R-T-Swt Vern) Itching   Onion Itching   Other Itching    Squash and Tomatoes cause itching and whelps.    Peanut-Containing Drug Products Itching    Itching and Whelps   Pollen Extract-Tree Extract [Pollen Extract] Itching    Family History: Family History  Problem Relation Age of Onset   Bone cancer Mother        deceased in late 30s   Aneurysm Father        deceased late 84s, brain   Cancer Brother    COPD Sister    Colon cancer Neg Hx     Social History:  reports that he quit smoking about 5 years ago. His smoking use included cigarettes. He quit smokeless tobacco use about 12 years ago. He reports that he does not currently use alcohol. He reports current drug use. Frequency: 2.00 times per week. Drug: Marijuana.   Physical Exam: BP 116/71   Pulse 66   Constitutional:  Alert and oriented, No acute distress. HEENT: [place] AT, moist mucus membranes.  Trachea midline, no masses. Cardiovascular: No clubbing, cyanosis, or edema. Respiratory: Normal respiratory effort, no increased work of breathing. GI: Abdomen is soft, nontender, nondistended, no abdominal masses GU: No CVA tenderness Skin: No rashes, bruises or suspicious lesions. Neurologic: Grossly intact, no focal deficits, moving all 4  extremities. Psychiatric: Normal mood and affect.  Laboratory Data: Lab Results  Component Value Date   WBC 5.3 06/19/2020   HGB 13.7 06/19/2020   HCT 40.5 06/19/2020   MCV 91.6 06/19/2020   PLT 253 06/19/2020    Lab Results  Component Value Date   CREATININE 0.74 06/19/2020    No results found for: "PSA"  No results found for: "TESTOSTERONE"  No results found for: "HGBA1C"  Urinalysis    Component Value Date/Time   COLORURINE YELLOW 03/29/2014 2045   APPEARANCEUR Clear 08/14/2021 1146   LABSPEC >1.030 (H) 03/29/2014 2045   PHURINE 6.0 03/29/2014 2045   GLUCOSEU Negative 08/14/2021 1146   HGBUR TRACE (A) 03/29/2014 2045   BILIRUBINUR Negative 08/14/2021 1146   KETONESUR NEGATIVE 03/29/2014 2045   PROTEINUR Negative 08/14/2021 1146   PROTEINUR NEGATIVE 03/29/2014 2045  UROBILINOGEN 0.2 03/29/2014 2045   NITRITE Negative 08/14/2021 1146   NITRITE NEGATIVE 03/29/2014 2045   LEUKOCYTESUR Trace (A) 08/14/2021 1146    Lab Results  Component Value Date   LABMICR See below: 08/14/2021   WBCUA 0-5 08/14/2021   LABEPIT 0-10 08/14/2021   BACTERIA None seen 08/14/2021    Pertinent Imaging: N/a   Assessment & Plan:    1. BPH with obstruction/lower urinary tract symptoms Disc nature r/b/a to tamsulosin and he will start.  - Urinalysis, Routine w reflex microscopic - BLADDER SCAN AMB NON-IMAGING  2. UTI - start cephalexin TID x 10 days. Urine sent for culture.   3. Microhematuria - kikely from UTI and prior imaging benign. Recheck urine again p abx.   No follow-ups on file.  Jerilee Field, MD  [facility]  [address] [place], [place] [address] [phone]

## 2022-12-10 NOTE — Progress Notes (Unsigned)
post void residual=84

## 2022-12-11 LAB — URINALYSIS, ROUTINE W REFLEX MICROSCOPIC
Bilirubin, UA: NEGATIVE
Ketones, UA: NEGATIVE
Nitrite, UA: POSITIVE — AB
Specific Gravity, UA: 1.02 (ref 1.005–1.030)
Urobilinogen, Ur: 1 mg/dL (ref 0.2–1.0)
pH, UA: 6 (ref 5.0–7.5)

## 2022-12-11 LAB — MICROSCOPIC EXAMINATION: WBC, UA: >30 /HPF — AB (ref 0–5)

## 2023-01-14 ENCOUNTER — Ambulatory Visit: Payer: Medicare HMO | Admitting: Urology

## 2023-01-14 ENCOUNTER — Encounter: Payer: Self-pay | Admitting: Urology

## 2023-01-14 VITALS — BP 159/76 | HR 51

## 2023-01-14 DIAGNOSIS — N401 Enlarged prostate with lower urinary tract symptoms: Secondary | ICD-10-CM | POA: Diagnosis not present

## 2023-01-14 DIAGNOSIS — N3000 Acute cystitis without hematuria: Secondary | ICD-10-CM

## 2023-01-14 DIAGNOSIS — R3912 Poor urinary stream: Secondary | ICD-10-CM

## 2023-01-14 DIAGNOSIS — Z87440 Personal history of urinary (tract) infections: Secondary | ICD-10-CM

## 2023-01-14 DIAGNOSIS — N138 Other obstructive and reflux uropathy: Secondary | ICD-10-CM

## 2023-01-14 LAB — URINALYSIS, ROUTINE W REFLEX MICROSCOPIC
Bilirubin, UA: NEGATIVE
Glucose, UA: NEGATIVE
Ketones, UA: NEGATIVE
Leukocytes,UA: NEGATIVE
Nitrite, UA: NEGATIVE
Protein,UA: NEGATIVE
RBC, UA: NEGATIVE
Specific Gravity, UA: 1.015 (ref 1.005–1.030)
Urobilinogen, Ur: 1 mg/dL (ref 0.2–1.0)
pH, UA: 7.5 (ref 5.0–7.5)

## 2023-01-14 NOTE — Progress Notes (Signed)
01/14/2023 1:09 PM   [name] 13-Oct-1955 [id number]  Referring provider: Alvina Filbert, MD [address] [place],  [place] [address]  No chief complaint on file.   HPI:  F/u -    1) BPH - c/o intermittent symptoms of hesitancy, intermittent stream, decreased force of stream, and nocturia 1-2 times.  No dysuria or gross hematuria.  He previously tried tamsulosin without improvement in his symptoms.  No history of UTIs or prostatitis. IPSS = 16.    03/22 CT benign, prostate ~ 30 g. PSA from 10/22: 2.4 with 25% free.    2) UTI - Sep 2024 - dysuria, hesistancy and cloudy urine for past 2-3 weeks. No gross hematuria. No fevers. PVR 84 ml. UA many bacteria, > 30 wbc and 3-10 rbc.   Today, seen for the above. Tx with cephalexin TID last visit. Urine cx ordered but I don't see it was done. His dysuria cleared up. He was started on tamsulosin. Now with urgency and wk stream. IPSS = 15. Noc X 1.    He was in textiles. Made yarn.    PMH: Past Medical History:  Diagnosis Date   Asthma    Bronchitis    COPD (chronic obstructive pulmonary disease) (HCC)    GERD (gastroesophageal reflux disease)    Hypercholesteremia    Hypertension    S/P endoscopy July 21, 2010   Dr. Jena Gauss: limited endoscopy, secondary to food impaction, Schatzki's ring seen    Surgical History: Past Surgical History:  Procedure Laterality Date   BIOPSY  06/28/2020   Procedure: BIOPSY;  Surgeon: Dolores Frame, MD;  [facility];  Service: Gastroenterology;;   COLONOSCOPY N/A 03/22/2016   Procedure: COLONOSCOPY;  Surgeon: Malissa Hippo, MD;  [facility];  Service: Endoscopy;  Laterality: N/A;  830   COLONOSCOPY N/A 07/08/2019   Procedure: COLONOSCOPY;  Surgeon: Malissa Hippo, MD;  [facility];  Service: Endoscopy;  Laterality: N/A;  1200   ESOPHAGOGASTRODUODENOSCOPY (EGD) WITH PROPOFOL N/A 06/28/2020   Procedure: ESOPHAGOGASTRODUODENOSCOPY (EGD) WITH  PROPOFOL;  Surgeon: Dolores Frame, MD;  [facility];  Service: Gastroenterology;  Laterality: N/A;  PM   lipoma removal from posterior neck     MASS EXCISION Right 07/19/2021   Procedure: EXCISION MASS RIGHT POSTERIOR EAR;  Surgeon: Lewie Chamber, DO;  [facility];  Service: General;  Laterality: Right;    Home Medications:  Allergies as of 01/14/2023       Reactions   American Cockroach Itching   Chocolate Other (See Comments)   reflux   Corn-containing Products Itching   Dust Mite Extract Itching   Grass Pollen(k-o-r-t-swt Vern) Itching   Onion Itching   Other Itching   Squash and Tomatoes cause itching and whelps.    Peanut-containing Drug Products Itching   Itching and Whelps   Pollen Extract-tree Extract [pollen Extract] Itching        Medication List        Accurate as of January 14, 2023  1:09 PM. If you have any questions, ask your nurse or doctor.          albuterol 108 (90 Base) MCG/ACT inhaler Commonly known as: VENTOLIN HFA Inhale 1 puff into the lungs every 6 (six) hours as needed for wheezing or shortness of breath.   albuterol (2.5 MG/3ML) 0.083% nebulizer solution Commonly known as: PROVENTIL Take 3 mLs (2.5 mg total) by nebulization every 6 (six) hours as  needed. For shortness of breath and/or wheezing   amLODipine 5 MG tablet Commonly known as: NORVASC Take 5 mg by mouth daily.   azelastine 0.05 % ophthalmic solution Commonly known as: OPTIVAR Place 1 drop into both eyes daily as needed (allergies).   azelastine 0.1 % nasal spray Commonly known as: ASTELIN Place 1 spray into both nostrils 2 (two) times daily as needed for allergies or rhinitis. Use in each nostril as directed   EPINEPHrine 0.3 mg/0.3 mL Soaj injection Commonly known as: EPI-PEN Inject 0.3 mg into the muscle as needed for anaphylaxis.   levocetirizine 5 MG tablet Commonly known as: XYZAL Take 5 mg by mouth every evening.    neomycin-bacitracin-polymyxin [id number] ointment Commonly known as: NEOSPORIN Apply 1 application. topically daily. Apply to your right ear surgical site daily   omeprazole 20 MG capsule Commonly known as: PRILOSEC Take 20 mg by mouth 2 (two) times daily as needed (acid reflux).   OVER THE COUNTER MEDICATION Allergy shots two shots Q Wednesday per patient for allergy to grass, cockroaches,dust mites, and trees.   oxyCODONE 5 MG immediate release tablet Commonly known as: Roxicodone Take 1 tablet (5 mg total) by mouth every 8 (eight) hours as needed.   tamsulosin 0.4 MG Caps capsule Commonly known as: FLOMAX Take 1 capsule (0.4 mg total) by mouth daily after supper.   Trelegy Ellipta 200-62.5-25 MCG/INH Aepb Generic drug: Fluticasone-Umeclidin-Vilant Inhale 1 puff into the lungs daily.        Allergies:  Allergies  Allergen Reactions   American Cockroach Itching   Chocolate Other (See Comments)    reflux   Corn-Containing Products Itching   Dust Mite Extract Itching   Grass Pollen(K-O-R-T-Swt Vern) Itching   Onion Itching   Other Itching    Squash and Tomatoes cause itching and whelps.    Peanut-Containing Drug Products Itching    Itching and Whelps   Pollen Extract-Tree Extract [Pollen Extract] Itching    Family History: Family History  Problem Relation Age of Onset   Bone cancer Mother        deceased in late 55s   Aneurysm Father        deceased late 37s, brain   Cancer Brother    COPD Sister    Colon cancer Neg Hx     Social History:  reports that he quit smoking about 5 years ago. His smoking use included cigarettes. He quit smokeless tobacco use about 12 years ago. He reports that he does not currently use alcohol. He reports current drug use. Frequency: 2.00 times per week. Drug: Marijuana.   Physical Exam: There were no vitals taken for this visit.  Constitutional:  Alert and oriented, No acute distress. HEENT: [place] AT, moist mucus membranes.   Trachea midline, no masses. Cardiovascular: No clubbing, cyanosis, or edema. Respiratory: Normal respiratory effort, no increased work of breathing. GI: Abdomen is soft, nontender, nondistended, no abdominal masses GU: No CVA tenderness Skin: No rashes, bruises or suspicious lesions. Neurologic: Grossly intact, no focal deficits, moving all 4 extremities. Psychiatric: Normal mood and affect.  Laboratory Data: Lab Results  Component Value Date   WBC 5.3 06/19/2020   HGB 13.7 06/19/2020   HCT 40.5 06/19/2020   MCV 91.6 06/19/2020   PLT 253 06/19/2020    Lab Results  Component Value Date   CREATININE 0.74 06/19/2020    No results found for: "PSA"  No results found for: "TESTOSTERONE"  No results found for: "HGBA1C"  Urinalysis  Component Value Date/Time   COLORURINE YELLOW 03/29/2014 2045   APPEARANCEUR Cloudy (A) 12/10/2022 1309   LABSPEC >1.030 (H) 03/29/2014 2045   PHURINE 6.0 03/29/2014 2045   GLUCOSEU 3+ (A) 12/10/2022 1309   HGBUR TRACE (A) 03/29/2014 2045   BILIRUBINUR Negative 12/10/2022 1309   KETONESUR NEGATIVE 03/29/2014 2045   PROTEINUR Trace 12/10/2022 1309   PROTEINUR NEGATIVE 03/29/2014 2045   UROBILINOGEN 0.2 03/29/2014 2045   NITRITE Positive (A) 12/10/2022 1309   NITRITE NEGATIVE 03/29/2014 2045   LEUKOCYTESUR 2+ (A) 12/10/2022 1309    Lab Results  Component Value Date   LABMICR See below: 12/10/2022   WBCUA >30 (A) 12/10/2022   LABEPIT 0-10 12/10/2022   BACTERIA Many (A) 12/10/2022    Pertinent Imaging: N/a Assessment & Plan:    1) UTI - resolved  2) BPH - cont tams. Disc adding pde5i or oab med. PSA was sent. Also disc procedures.   No follow-ups on file.  Jerilee Field, MD  [facility]  [address] [place], [place] [address] [phone]

## 2023-01-15 LAB — PSA: Prostate Specific Ag, Serum: 3.9 ng/mL (ref 0.0–4.0)

## 2023-01-18 ENCOUNTER — Telehealth: Payer: Self-pay | Admitting: Urology

## 2023-01-18 NOTE — Telephone Encounter (Signed)
Patient would like a call back about his PSA results.

## 2023-01-18 NOTE — Telephone Encounter (Signed)
Patient called and notified of PSA result. Patient voiced understanding.

## 2023-01-18 NOTE — Telephone Encounter (Signed)
Please review latest PSA result

## 2023-02-28 ENCOUNTER — Encounter: Payer: Self-pay | Admitting: Surgery

## 2023-02-28 ENCOUNTER — Ambulatory Visit: Payer: Medicare HMO | Admitting: Surgery

## 2023-02-28 VITALS — BP 145/92 | HR 69 | Temp 98.2°F | Resp 12 | Ht 69.0 in | Wt 134.0 lb

## 2023-02-28 DIAGNOSIS — D235 Other benign neoplasm of skin of trunk: Secondary | ICD-10-CM

## 2023-02-28 DIAGNOSIS — D367 Benign neoplasm of other specified sites: Secondary | ICD-10-CM

## 2023-02-28 NOTE — Progress Notes (Signed)
[facility] History and Physical  Reason for Referral: Cyst on back of neck and shoulder Referring Physician: Self-referral  Chief Complaint   Mass on back of neck     [name] is a 67 y.o. male.  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  He is known to me, as I removed a cyst from his right ear in 2023.  He states that he underwent removal of a posterior neck cyst 30 years ago with Dr. Leona Carry.  He noted that it recurred about 5 years ago, and now it is starting to cause him concern as it is getting larger.  He also has a cyst on his left shoulder that has been present for 7 years and now intermittently becomes sore.  He states that his daughters will occasionally drain the area, but they have been unable to do so recently.  He denies either area ever being infected or needing to be opened and drained.  His past medical history is significant for hypertension, allergies, and GERD.  He denies use of blood thinning medications.  He denies any other surgeries since he was last seen by me.  He drinks about a sixpack of alcohol per week and smokes marijuana daily.  He denies use of tobacco products.  Past Medical History:  Diagnosis Date   Asthma    Bronchitis    COPD (chronic obstructive pulmonary disease) (HCC)    GERD (gastroesophageal reflux disease)    Hypercholesteremia    Hypertension    S/P endoscopy July 21, 2010   Dr. Jena Gauss: limited endoscopy, secondary to food impaction, Schatzki's ring seen    Past Surgical History:  Procedure Laterality Date   BIOPSY  06/28/2020   Procedure: BIOPSY;  Surgeon: Dolores Frame, MD;  [facility];  Service: Gastroenterology;;   COLONOSCOPY N/A 03/22/2016   Procedure: COLONOSCOPY;  Surgeon: Malissa Hippo, MD;  [facility];  Service: Endoscopy;  Laterality: N/A;  830   COLONOSCOPY N/A 07/08/2019   Procedure: COLONOSCOPY;  Surgeon: Malissa Hippo, MD;  [facility];  Service: Endoscopy;  Laterality: N/A;  1200   ESOPHAGOGASTRODUODENOSCOPY (EGD) WITH PROPOFOL N/A 06/28/2020   Procedure: ESOPHAGOGASTRODUODENOSCOPY (EGD) WITH PROPOFOL;  Surgeon: Dolores Frame, MD;  [facility];  Service: Gastroenterology;  Laterality: N/A;  PM   lipoma removal from posterior neck     MASS EXCISION Right 07/19/2021   Procedure: EXCISION MASS RIGHT POSTERIOR EAR;  Surgeon: Lewie Chamber, DO;  Location: [facility];  Service: General;  Laterality: Right;    Family History  Problem Relation Age of Onset   Bone cancer Mother        deceased in late 50s   Aneurysm Father        deceased late 84s, brain   Cancer Brother    COPD Sister    Colon cancer Neg Hx     Social History   Tobacco Use   Smoking status: Former    Current packs/day: 0.00    Types: Cigarettes    Quit date: 2019    Years since quitting: 5.9   Smokeless tobacco: Former    Quit date: 03/11/2010  Vaping Use   Vaping status: Never Used  Substance Use Topics   Alcohol use: Not Currently    Comment: 6 pack a week sometime a couple of shots   Drug use: Yes    Frequency: 2.0  times per week    Types: Marijuana    Comment: occ    Medications: I have reviewed the patient's current medications. Allergies as of 02/28/2023       Reactions   American Cockroach Itching   Chocolate Other (See Comments)   reflux   Corn-containing Products Itching   Dust Mite Extract Itching   Grass Pollen(k-o-r-t-swt Vern) Itching   Onion Itching   Other Itching   Squash and Tomatoes cause itching and whelps.    Peanut-containing Drug Products Itching   Itching and Whelps   Pollen Extract-tree Extract [pollen Extract] Itching        Medication List        Accurate as of February 28, 2023 12:57 PM. If you have any questions, ask your nurse or doctor.          STOP taking these medications    oxyCODONE 5 MG immediate release  tablet Commonly known as: Roxicodone Stopped by: Deontaye Civello A Jaiona Simien       TAKE these medications    albuterol 108 (90 Base) MCG/ACT inhaler Commonly known as: VENTOLIN HFA Inhale 1 puff into the lungs every 6 (six) hours as needed for wheezing or shortness of breath.   albuterol (2.5 MG/3ML) 0.083% nebulizer solution Commonly known as: PROVENTIL Take 3 mLs (2.5 mg total) by nebulization every 6 (six) hours as needed. For shortness of breath and/or wheezing   amLODipine 5 MG tablet Commonly known as: NORVASC Take 5 mg by mouth daily.   azelastine 0.05 % ophthalmic solution Commonly known as: OPTIVAR Place 1 drop into both eyes daily as needed (allergies).   azelastine 0.1 % nasal spray Commonly known as: ASTELIN Place 1 spray into both nostrils 2 (two) times daily as needed for allergies or rhinitis. Use in each nostril as directed   EPINEPHrine 0.3 mg/0.3 mL Soaj injection Commonly known as: EPI-PEN Inject 0.3 mg into the muscle as needed for anaphylaxis.   levocetirizine 5 MG tablet Commonly known as: XYZAL Take 5 mg by mouth every evening.   neomycin-bacitracin-polymyxin [id number] ointment Commonly known as: NEOSPORIN Apply 1 application. topically daily. Apply to your right ear surgical site daily   omeprazole 20 MG capsule Commonly known as: PRILOSEC Take 20 mg by mouth 2 (two) times daily as needed (acid reflux).   OVER THE COUNTER MEDICATION Allergy shots two shots Q Wednesday per patient for allergy to grass, cockroaches,dust mites, and trees.   tamsulosin 0.4 MG Caps capsule Commonly known as: FLOMAX Take 1 capsule (0.4 mg total) by mouth daily after supper.   Trelegy Ellipta 200-62.5-25 MCG/INH Aepb Generic drug: Fluticasone-Umeclidin-Vilant Inhale 1 puff into the lungs daily.         ROS:  Constitutional: negative for chills, fatigue, and fevers Eyes: negative for visual disturbance and pain Ears, nose, mouth, throat, and face: positive  for sinus problems, negative for ear drainage and sore throat Respiratory: positive for wheezing and shortness of breath, negative for cough Cardiovascular: negative for chest pain and palpitations Gastrointestinal: positive for abdominal pain and reflux symptoms, negative for nausea and vomiting Genitourinary:positive for dysuria and frequency Integument/breast: negative for dryness and rash Hematologic/lymphatic: negative for bleeding and lymphadenopathy Musculoskeletal:negative for back pain and neck pain Neurological: negative for dizziness and tremors Endocrine: negative for temperature intolerance  Blood pressure (!) 145/92, pulse 69, temperature 98.2 F (36.8 C), temperature source Oral, resp. rate 12, height 5\' 9"  (1.753 m), weight 134 lb (60.8 kg), SpO2 95%. Physical Exam Vitals reviewed.  Constitutional:      Appearance: Normal appearance.  HENT:     Head: Normocephalic and atraumatic.  Eyes:     Extraocular Movements: Extraocular movements intact.     Pupils: Pupils are equal, round, and reactive to light.  Cardiovascular:     Rate and Rhythm: Normal rate and regular rhythm.  Pulmonary:     Effort: Pulmonary effort is normal.     Breath sounds: Normal breath sounds.  Abdominal:     General: There is no distension.     Palpations: Abdomen is soft.     Tenderness: There is no abdominal tenderness.  Musculoskeletal:        General: Normal range of motion.     Cervical back: Normal range of motion.  Skin:    General: Skin is warm and dry.     Comments: 3.5 cm posterior neck cyst, nontender to palpation, no surrounding induration or erythema; 3 cm at left shoulder cyst, nontender to palpation, no surrounding induration or erythema  Neurological:     General: No focal deficit present.     Mental Status: He is alert and oriented to person, place, and time.  Psychiatric:        Mood and Affect: Mood normal.        Behavior: Behavior normal.     Results: No results  found for this or any previous visit (from the past 48 hour(s)).  No results found.   Assessment & Plan:  [name] is a 67 y.o. male who presents for evaluation of a posterior neck and left shoulder cyst.  -We discussed the pathophysiology of cyst, and why we recommend surgical excision.  Given the areas are causing him pain and increasing in size, it is appropriate to proceed with surgery. -The risk and benefits of posterior neck cyst excision, and left shoulder cyst excision were discussed including but not limited to bleeding, infection, injury to surrounding structures, need for additional procedures.  After careful consideration, PHILLP GILLIN has decided to proceed with surgery. -Patient tentatively scheduled for surgery on 12/13 -Information provided to the patient regarding epidermoid cyst and epidermoid cyst removal  All questions were answered to the satisfaction of the patient.  Theophilus Kinds, DO [facility] [address] [place], [place] [address] [phone] (office)

## 2023-03-01 NOTE — H&P (Signed)
[facility] History and Physical  Reason for Referral: Cyst on back of neck and shoulder Referring Physician: Self-referral  Chief Complaint   Mass on back of neck     [name] is a 67 y.o. male.  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  He is known to me, as I removed a cyst from his right ear in 2023.  He states that he underwent removal of a posterior neck cyst 30 years ago with Dr. Leona Carry.  He noted that it recurred about 5 years ago, and now it is starting to cause him concern as it is getting larger.  He also has a cyst on his left shoulder that has been present for 7 years and now intermittently becomes sore.  He states that his daughters will occasionally drain the area, but they have been unable to do so recently.  He denies either area ever being infected or needing to be opened and drained.  His past medical history is significant for hypertension, allergies, and GERD.  He denies use of blood thinning medications.  He denies any other surgeries since he was last seen by me.  He drinks about a sixpack of alcohol per week and smokes marijuana daily.  He denies use of tobacco products.  Past Medical History:  Diagnosis Date   Asthma    Bronchitis    COPD (chronic obstructive pulmonary disease) (HCC)    GERD (gastroesophageal reflux disease)    Hypercholesteremia    Hypertension    S/P endoscopy July 21, 2010   Dr. Jena Gauss: limited endoscopy, secondary to food impaction, Schatzki's ring seen    Past Surgical History:  Procedure Laterality Date   BIOPSY  06/28/2020   Procedure: BIOPSY;  Surgeon: Dolores Frame, MD;  Location: [facility];  Service: Gastroenterology;;   COLONOSCOPY N/A 03/22/2016   Procedure: COLONOSCOPY;  Surgeon: Malissa Hippo, MD;  Location: [facility];  Service: Endoscopy;  Laterality: N/A;  830   COLONOSCOPY N/A 07/08/2019   Procedure: COLONOSCOPY;  Surgeon: Malissa Hippo, MD;  Location: [facility];  Service: Endoscopy;  Laterality: N/A;  1200   ESOPHAGOGASTRODUODENOSCOPY (EGD) WITH PROPOFOL N/A 06/28/2020   Procedure: ESOPHAGOGASTRODUODENOSCOPY (EGD) WITH PROPOFOL;  Surgeon: Dolores Frame, MD;  Location: [facility];  Service: Gastroenterology;  Laterality: N/A;  PM   lipoma removal from posterior neck     MASS EXCISION Right 07/19/2021   Procedure: EXCISION MASS RIGHT POSTERIOR EAR;  Surgeon: Lewie Chamber, DO;  Location: [facility];  Service: General;  Laterality: Right;    Family History  Problem Relation Age of Onset   Bone cancer Mother        deceased in late 50s   Aneurysm Father        deceased late 84s, brain   Cancer Brother    COPD Sister    Colon cancer Neg Hx     Social History   Tobacco Use   Smoking status: Former    Current packs/day: 0.00    Types: Cigarettes    Quit date: 2019    Years since quitting: 5.9   Smokeless tobacco: Former    Quit date: 03/11/2010  Vaping Use   Vaping status: Never Used  Substance Use Topics   Alcohol use: Not Currently    Comment: 6 pack a week sometime a couple of shots   Drug use: Yes    Frequency: 2.0  times per week    Types: Marijuana    Comment: occ    Medications: I have reviewed the patient's current medications. Allergies as of 02/28/2023       Reactions   American Cockroach Itching   Chocolate Other (See Comments)   reflux   Corn-containing Products Itching   Dust Mite Extract Itching   Grass Pollen(k-o-[id number]-t-swt Vern) Itching   Onion Itching   Other Itching   Squash and Tomatoes cause itching and whelps.    Peanut-containing Drug Products Itching   Itching and Whelps   Pollen Extract-tree Extract [pollen Extract] Itching        Medication List        Accurate as of February 28, 2023 12:57 PM. If you have any questions, ask your nurse or doctor.          STOP taking these medications    oxyCODONE 5 MG immediate release  tablet Commonly known as: Roxicodone Stopped by: Deontaye Civello A Jaiona Simien       TAKE these medications    albuterol 108 (90 Base) MCG/ACT inhaler Commonly known as: VENTOLIN HFA Inhale 1 puff into the lungs every 6 (six) hours as needed for wheezing or shortness of breath.   albuterol (2.5 MG/3ML) 0.083% nebulizer solution Commonly known as: PROVENTIL Take 3 mLs (2.5 mg total) by nebulization every 6 (six) hours as needed. For shortness of breath and/or wheezing   amLODipine 5 MG tablet Commonly known as: NORVASC Take 5 mg by mouth daily.   azelastine 0.05 % ophthalmic solution Commonly known as: OPTIVAR Place 1 drop into both eyes daily as needed (allergies).   azelastine 0.1 % nasal spray Commonly known as: ASTELIN Place 1 spray into both nostrils 2 (two) times daily as needed for allergies or rhinitis. Use in each nostril as directed   EPINEPHrine 0.3 mg/0.3 mL Soaj injection Commonly known as: EPI-PEN Inject 0.3 mg into the muscle as needed for anaphylaxis.   levocetirizine 5 MG tablet Commonly known as: XYZAL Take 5 mg by mouth every evening.   neomycin-bacitracin-polymyxin [id number] ointment Commonly known as: NEOSPORIN Apply 1 application. topically daily. Apply to your right ear surgical site daily   omeprazole 20 MG capsule Commonly known as: PRILOSEC Take 20 mg by mouth 2 (two) times daily as needed (acid reflux).   OVER THE COUNTER MEDICATION Allergy shots two shots Q Wednesday per patient for allergy to grass, cockroaches,dust mites, and trees.   tamsulosin 0.4 MG Caps capsule Commonly known as: FLOMAX Take 1 capsule (0.4 mg total) by mouth daily after supper.   Trelegy Ellipta 200-62.5-25 MCG/INH Aepb Generic drug: Fluticasone-Umeclidin-Vilant Inhale 1 puff into the lungs daily.         ROS:  Constitutional: negative for chills, fatigue, and fevers Eyes: negative for visual disturbance and pain Ears, nose, mouth, throat, and face: positive  for sinus problems, negative for ear drainage and sore throat Respiratory: positive for wheezing and shortness of breath, negative for cough Cardiovascular: negative for chest pain and palpitations Gastrointestinal: positive for abdominal pain and reflux symptoms, negative for nausea and vomiting Genitourinary:positive for dysuria and frequency Integument/breast: negative for dryness and rash Hematologic/lymphatic: negative for bleeding and lymphadenopathy Musculoskeletal:negative for back pain and neck pain Neurological: negative for dizziness and tremors Endocrine: negative for temperature intolerance  Blood pressure (!) 145/92, pulse 69, temperature 98.2 F (36.8 C), temperature source Oral, resp. rate 12, height 5\' 9"  (1.753 m), weight 134 lb (60.8 kg), SpO2 95%. Physical Exam Vitals reviewed.  Constitutional:      Appearance: Normal appearance.  HENT:     Head: Normocephalic and atraumatic.  Eyes:     Extraocular Movements: Extraocular movements intact.     Pupils: Pupils are equal, round, and reactive to light.  Cardiovascular:     Rate and Rhythm: Normal rate and regular rhythm.  Pulmonary:     Effort: Pulmonary effort is normal.     Breath sounds: Normal breath sounds.  Abdominal:     General: There is no distension.     Palpations: Abdomen is soft.     Tenderness: There is no abdominal tenderness.  Musculoskeletal:        General: Normal range of motion.     Cervical back: Normal range of motion.  Skin:    General: Skin is warm and dry.     Comments: 3.5 cm posterior neck cyst, nontender to palpation, no surrounding induration or erythema; 3 cm at left shoulder cyst, nontender to palpation, no surrounding induration or erythema  Neurological:     General: No focal deficit present.     Mental Status: He is alert and oriented to person, place, and time.  Psychiatric:        Mood and Affect: Mood normal.        Behavior: Behavior normal.     Results: No results  found for this or any previous visit (from the past 48 hour(s)).  No results found.   Assessment & Plan:  [name] is a 67 y.o. male who presents for evaluation of a posterior neck and left shoulder cyst.  -We discussed the pathophysiology of cyst, and why we recommend surgical excision.  Given the areas are causing him pain and increasing in size, it is appropriate to proceed with surgery. -The risk and benefits of posterior neck cyst excision, and left shoulder cyst excision were discussed including but not limited to bleeding, infection, injury to surrounding structures, need for additional procedures.  After careful consideration, [name] has decided to proceed with surgery. -Patient tentatively scheduled for surgery on 12/13 -Information provided to the patient regarding epidermoid cyst and epidermoid cyst removal  All questions were answered to the satisfaction of the patient.  Melvin Kinds, DO [facility] [address] [place], [place] [address] [phone] (office)

## 2023-03-06 NOTE — Patient Instructions (Signed)
[name]  03/06/2023     @PREFPERIOPPHARMACY @   Your procedure is scheduled on  03/08/2023.   Report to [facility] at  [phone]  A.M.   Call this number if you have problems the morning of surgery:  [phone]  If you experience any cold or flu symptoms such as cough, fever, chills, shortness of breath, etc. between now and your scheduled surgery, please notify us at the above number.   Remember:  Do not eat after midnight.   You may drink clear liquids until 0610 am on 03/08/2023.    Clear liquids allowed are:                    Water, Juice (No red color; non-citric and without pulp; diabetics please choose diet or no sugar options), Carbonated beverages (diabetics please choose diet or no sugar options), Clear Tea (No creamer, milk, or cream, including half & half and powdered creamer), Black Coffee Only (No creamer, milk or cream, including half & half and powdered creamer), and Clear Sports drink (No red color; diabetics please choose diet or no sugar options)    Take these medicines the morning of surgery with A SIP OF WATER                                        Amlodipine.    Do not wear jewelry, make-up or nail polish, including gel polish,  artificial nails, or any other type of covering on natural nails (fingers and  toes).  Do not wear lotions, powders, or perfumes, or deodorant.  Do not shave 48 hours prior to surgery.  Men may shave face and neck.  Do not bring valuables to the hospital.  [facility] is not responsible for any belongings or valuables.  Contacts, dentures or bridgework may not be worn into surgery.  Leave your suitcase in the car.  After surgery it may be brought to your room.  For patients admitted to the hospital, discharge time will be determined by your treatment team.  Patients discharged the day of surgery will not be allowed to drive home and must have someone with them for 24 hours.    Special instructions:   DO NOT smoke  tobacco or vape for 24 hours before your procedure.  Please read over the following fact sheets that you were given. Anesthesia Post-op Instructions and Care and Recovery After Surgery        Excision of Skin Lesions, Care After The following information offers guidance on how to care for yourself after your procedure. Your health care provider may also give you more specific instructions. If you have problems or questions, contact your health care provider. What can I expect after the procedure? After your procedure, it is common to have: Soreness or mild pain. Some redness and swelling. Follow these instructions at home: Excision site care  Follow instructions from your health care provider about how to take care of your excision site. Make sure you: Wash your hands with soap and water for at least 20 seconds before and after you change your bandage (dressing). If soap and water are not available, use hand sanitizer. Change your dressing as told by your health care provider. Leave stitches (sutures), skin glue, or adhesive strips in place. These skin closures may need to stay in place for 2 weeks or  longer. If adhesive strip edges start to loosen and curl up, you may trim the loose edges. Do not remove adhesive strips completely unless your health care provider tells you to do that. Check the excision area every day for signs of infection. Watch for: More redness, swelling, or pain. Fluid or blood. Warmth. Pus or a bad smell. Keep the site clean, dry, and protected for at least 48 hours. For bleeding, apply gentle but firm pressure to the area using a folded towel for 20 minutes. Do not take baths, swim, or use a hot tub until your health care provider approves. Ask your health care provider if you may take showers. You may only be allowed to take sponge baths. General instructions Take over-the-counter and prescription medicines only as told by your health care provider. Follow  instructions from your health care provider about how to minimize scarring. Scarring should lessen over time. Avoid sun exposure until the area has healed. Use sunscreen to protect the area from the sun after it has healed. Avoid high-impact exercise and activities until the sutures are removed or the area heals. Keep all follow-up visits. This is important. Contact a health care provider if: You have more redness, swelling, or pain around your excision site. You have fluid or blood coming from your excision site. Your excision site feels warm to the touch. You have pus or a bad smell coming from your excision site. You have a fever. You have pain that does not improve in 2-3 days after your procedure. Get help right away if: You have bleeding that does not stop with pressure or a dressing. Your wound opens up. Summary Take over-the-counter and prescription medicines only as told by your health care provider. Change your dressing as told by your health care provider. Contact a health care provider if you have redness, swelling, pain, or other signs of infection around your excision site. Keep all follow-up visits. This is important. This information is not intended to replace advice given to you by your health care provider. Make sure you discuss any questions you have with your health care provider. Document Revised: 10/11/2020 Document Reviewed: 10/11/2020 Elsevier Patient Education  2024 Else[employer]. General Anesthesia, Adult, Care After The following information offers guidance on how to care for yourself after your procedure. Your health care provider may also give you more specific instructions. If you have problems or questions, contact your health care provider. What can I expect after the procedure? After the procedure, it is common for people to: Have pain or discomfort at the IV site. Have nausea or vomiting. Have a sore throat or hoarseness. Have trouble  concentrating. Feel cold or chills. Feel weak, sleepy, or tired (fatigue). Have soreness and body aches. These can affect parts of the body that were not involved in surgery. Follow these instructions at home: For the time period you were told by your health care provider:  Rest. Do not participate in activities where you could fall or become injured. Do not drive or use machinery. Do not drink alcohol. Do not take sleeping pills or medicines that cause drowsiness. Do not make important decisions or sign legal documents. Do not take care of children on your own. General instructions Drink enough fluid to keep your urine pale yellow. If you have sleep apnea, surgery and certain medicines can increase your risk for breathing problems. Follow instructions from your health care provider about wearing your sleep device: Anytime you are sleeping, including during daytime naps. While  taking prescription pain medicines, sleeping medicines, or medicines that make you drowsy. Return to your normal activities as told by your health care provider. Ask your health care provider what activities are safe for you. Take over-the-counter and prescription medicines only as told by your health care provider. Do not use any products that contain nicotine or tobacco. These products include cigarettes, chewing tobacco, and vaping devices, such as e-cigarettes. These can delay incision healing after surgery. If you need help quitting, ask your health care provider. Contact a health care provider if: You have nausea or vomiting that does not get better with medicine. You vomit every time you eat or drink. You have pain that does not get better with medicine. You cannot urinate or have bloody urine. You develop a skin rash. You have a fever. Get help right away if: You have trouble breathing. You have chest pain. You vomit blood. These symptoms may be an emergency. Get help right away. Call 911. Do not wait  to see if the symptoms will go away. Do not drive yourself to the hospital. Summary After the procedure, it is common to have a sore throat, hoarseness, nausea, vomiting, or to feel weak, sleepy, or fatigue. For the time period you were told by your health care provider, do not drive or use machinery. Get help right away if you have difficulty breathing, have chest pain, or vomit blood. These symptoms may be an emergency. This information is not intended to replace advice given to you by your health care provider. Make sure you discuss any questions you have with your health care provider. Document Revised: 06/09/2021 Document Reviewed: 06/09/2021 Elsevier Patient Education  2024 Elsevier Inc. How to Use Chlorhexidine Before Surgery Chlorhexidine gluconate (CHG) is a germ-killing (antiseptic) solution that is used to clean the skin. It can get rid of the bacteria that normally live on the skin and can keep them away for about 24 hours. To clean your skin with CHG, you may be given: A CHG solution to use in the shower or as part of a sponge bath. A prepackaged cloth that contains CHG. Cleaning your skin with CHG may help lower the risk for infection: While you are staying in the intensive care unit of the hospital. If you have a vascular access, such as a central line, to provide short-term or Langsam-term access to your veins. If you have a catheter to drain urine from your bladder. If you are on a ventilator. A ventilator is a machine that helps you breathe by moving air in and out of your lungs. After surgery. What are the risks? Risks of using CHG include: A skin reaction. Hearing loss, if CHG gets in your ears and you have a perforated eardrum. Eye injury, if CHG gets in your eyes and is not rinsed out. The CHG product catching fire. Make sure that you avoid smoking and flames after applying CHG to your skin. Do not use CHG: If you have a chlorhexidine allergy or have previously reacted  to chlorhexidine. On babies younger than 16 months of age. How to use CHG solution Use CHG only as told by your health care provider, and follow the instructions on the label. Use the full amount of CHG as directed. Usually, this is one bottle. During a shower Follow these steps when using CHG solution during a shower (unless your health care provider gives you different instructions): Start the shower. Use your normal soap and shampoo to wash your face and hair. Turn  off the shower or move out of the shower stream. Pour the CHG onto a clean washcloth. Do not use any type of brush or rough-edged sponge. Starting at your neck, lather your body down to your toes. Make sure you follow these instructions: If you will be having surgery, pay special attention to the part of your body where you will be having surgery. Scrub this area for at least 1 minute. Do not use CHG on your head or face. If the solution gets into your ears or eyes, rinse them well with water. Avoid your genital area. Avoid any areas of skin that have broken skin, cuts, or scrapes. Scrub your back and under your arms. Make sure to wash skin folds. Let the lather sit on your skin for 1-2 minutes or as Guterrez as told by your health care provider. Thoroughly rinse your entire body in the shower. Make sure that all body creases and crevices are rinsed well. Dry off with a clean towel. Do not put any substances on your body afterward--such as powder, lotion, or perfume--unless you are told to do so by your health care provider. Only use lotions that are recommended by the manufacturer. Put on clean clothes or pajamas. If it is the night before your surgery, sleep in clean sheets.  During a sponge bath Follow these steps when using CHG solution during a sponge bath (unless your health care provider gives you different instructions): Use your normal soap and shampoo to wash your face and hair. Pour the CHG onto a clean  washcloth. Starting at your neck, lather your body down to your toes. Make sure you follow these instructions: If you will be having surgery, pay special attention to the part of your body where you will be having surgery. Scrub this area for at least 1 minute. Do not use CHG on your head or face. If the solution gets into your ears or eyes, rinse them well with water. Avoid your genital area. Avoid any areas of skin that have broken skin, cuts, or scrapes. Scrub your back and under your arms. Make sure to wash skin folds. Let the lather sit on your skin for 1-2 minutes or as Bas as told by your health care provider. Using a different clean, wet washcloth, thoroughly rinse your entire body. Make sure that all body creases and crevices are rinsed well. Dry off with a clean towel. Do not put any substances on your body afterward--such as powder, lotion, or perfume--unless you are told to do so by your health care provider. Only use lotions that are recommended by the manufacturer. Put on clean clothes or pajamas. If it is the night before your surgery, sleep in clean sheets. How to use CHG prepackaged cloths Only use CHG cloths as told by your health care provider, and follow the instructions on the label. Use the CHG cloth on clean, dry skin. Do not use the CHG cloth on your head or face unless your health care provider tells you to. When washing with the CHG cloth: Avoid your genital area. Avoid any areas of skin that have broken skin, cuts, or scrapes. Before surgery Follow these steps when using a CHG cloth to clean before surgery (unless your health care provider gives you different instructions): Using the CHG cloth, vigorously scrub the part of your body where you will be having surgery. Scrub using a back-and-forth motion for 3 minutes. The area on your body should be completely wet with CHG when you are  done scrubbing. Do not rinse. Discard the cloth and let the area air-dry. Do not put  any substances on the area afterward, such as powder, lotion, or perfume. Put on clean clothes or pajamas. If it is the night before your surgery, sleep in clean sheets.  For general bathing Follow these steps when using CHG cloths for general bathing (unless your health care provider gives you different instructions). Use a separate CHG cloth for each area of your body. Make sure you wash between any folds of skin and between your fingers and toes. Wash your body in the following order, switching to a new cloth after each step: The front of your neck, shoulders, and chest. Both of your arms, under your arms, and your hands. Your stomach and groin area, avoiding the genitals. Your right leg and foot. Your left leg and foot. The back of your neck, your back, and your buttocks. Do not rinse. Discard the cloth and let the area air-dry. Do not put any substances on your body afterward--such as powder, lotion, or perfume--unless you are told to do so by your health care provider. Only use lotions that are recommended by the manufacturer. Put on clean clothes or pajamas. Contact a health care provider if: Your skin gets irritated after scrubbing. You have questions about using your solution or cloth. You swallow any chlorhexidine. Call your local poison control center (820-436-4090 in the [place] Get help right away if: Your eyes itch badly, or they become very red or swollen. Your skin itches badly and is red or swollen. Your hearing changes. You have trouble seeing. You have swelling or tingling in your mouth or throat. You have trouble breathing. These symptoms may represent a serious problem that is an emergency. Do not wait to see if the symptoms will go away. Get medical help right away. Call your local emergency services (911 in the [place] Do not drive yourself to the hospital. Summary Chlorhexidine gluconate (CHG) is a germ-killing (antiseptic) solution that is used to clean the skin.  Cleaning your skin with CHG may help to lower your risk for infection. You may be given CHG to use for bathing. It may be in a bottle or in a prepackaged cloth to use on your skin. Carefully follow your health care provider's instructions and the instructions on the product label. Do not use CHG if you have a chlorhexidine allergy. Contact your health care provider if your skin gets irritated after scrubbing. This information is not intended to replace advice given to you by your health care provider. Make sure you discuss any questions you have with your health care provider. Document Revised: 07/10/2021 Document Reviewed: 05/23/2020 Elsevier Patient Education  2023 [employer].

## 2023-03-07 ENCOUNTER — Encounter (HOSPITAL_COMMUNITY)
Admission: RE | Admit: 2023-03-07 | Discharge: 2023-03-07 | Disposition: A | Discharge status: Home / Self Care | Payer: Medicare HMO | Source: Ambulatory Visit | Attending: Surgery | Admitting: Surgery

## 2023-03-07 VITALS — BP 145/88 | HR 69 | Temp 98.2°F | Resp 18 | Ht 69.0 in | Wt 134.0 lb

## 2023-03-07 DIAGNOSIS — Z01818 Encounter for other preprocedural examination: Secondary | ICD-10-CM | POA: Insufficient documentation

## 2023-03-07 DIAGNOSIS — I10 Essential (primary) hypertension: Secondary | ICD-10-CM | POA: Diagnosis not present

## 2023-03-07 DIAGNOSIS — D649 Anemia, unspecified: Secondary | ICD-10-CM | POA: Diagnosis not present

## 2023-03-07 DIAGNOSIS — I498 Other specified cardiac arrhythmias: Secondary | ICD-10-CM | POA: Insufficient documentation

## 2023-03-07 LAB — CBC WITH DIFFERENTIAL/PLATELET
Abs Immature Granulocytes: 0 10*3/uL (ref 0.00–0.07)
Basophils Absolute: 0 10*3/uL (ref 0.0–0.1)
Basophils Relative: 1 %
Eosinophils Absolute: 0.2 10*3/uL (ref 0.0–0.5)
Eosinophils Relative: 7 %
HCT: 38.9 % — ABNORMAL LOW (ref 39.0–52.0)
Hemoglobin: 13.2 g/dL (ref 13.0–17.0)
Immature Granulocytes: 0 %
Lymphocytes Relative: 36 %
Lymphs Abs: 1.2 10*3/uL (ref 0.7–4.0)
MCH: 31.8 pg (ref 26.0–34.0)
MCHC: 33.9 g/dL (ref 30.0–36.0)
MCV: 93.7 fL (ref 80.0–100.0)
Monocytes Absolute: 0.5 10*3/uL (ref 0.1–1.0)
Monocytes Relative: 16 %
Neutro Abs: 1.3 10*3/uL — ABNORMAL LOW (ref 1.7–7.7)
Neutrophils Relative %: 40 %
Platelets: 233 10*3/uL (ref 150–400)
RBC: 4.15 MIL/uL — ABNORMAL LOW (ref 4.22–5.81)
RDW: 13.6 % (ref 11.5–15.5)
WBC: 3.3 10*3/uL — ABNORMAL LOW (ref 4.0–10.5)
nRBC: 0 % (ref 0.0–0.2)

## 2023-03-08 ENCOUNTER — Encounter (HOSPITAL_COMMUNITY): Payer: Self-pay | Admitting: Surgery

## 2023-03-08 ENCOUNTER — Ambulatory Visit (HOSPITAL_COMMUNITY)
Admission: RE | Admit: 2023-03-08 | Discharge: 2023-03-08 | Disposition: A | Discharge status: Home / Self Care | Payer: Medicare HMO | Attending: Surgery | Admitting: Surgery

## 2023-03-08 ENCOUNTER — Ambulatory Visit (HOSPITAL_COMMUNITY): Payer: Medicare HMO | Admitting: Certified Registered"

## 2023-03-08 ENCOUNTER — Encounter (HOSPITAL_COMMUNITY)
Admission: RE | Disposition: A | Discharge status: Home / Self Care | Payer: Self-pay | Source: Home / Self Care | Attending: Surgery

## 2023-03-08 DIAGNOSIS — R221 Localized swelling, mass and lump, neck: Secondary | ICD-10-CM

## 2023-03-08 DIAGNOSIS — R220 Localized swelling, mass and lump, head: Secondary | ICD-10-CM | POA: Diagnosis not present

## 2023-03-08 DIAGNOSIS — I10 Essential (primary) hypertension: Secondary | ICD-10-CM | POA: Insufficient documentation

## 2023-03-08 DIAGNOSIS — L720 Epidermal cyst: Secondary | ICD-10-CM | POA: Diagnosis not present

## 2023-03-08 DIAGNOSIS — J4489 Other specified chronic obstructive pulmonary disease: Secondary | ICD-10-CM | POA: Insufficient documentation

## 2023-03-08 DIAGNOSIS — Z87891 Personal history of nicotine dependence: Secondary | ICD-10-CM | POA: Diagnosis not present

## 2023-03-08 DIAGNOSIS — K219 Gastro-esophageal reflux disease without esophagitis: Secondary | ICD-10-CM | POA: Insufficient documentation

## 2023-03-08 HISTORY — PX: CYST REMOVAL NECK: SHX6281

## 2023-03-08 HISTORY — PX: MASS EXCISION: SHX2000

## 2023-03-08 SURGERY — EXCISION MASS
Anesthesia: General | Site: Shoulder

## 2023-03-08 MED ORDER — ROCURONIUM BROMIDE 10 MG/ML (PF) SYRINGE
PREFILLED_SYRINGE | INTRAVENOUS | Status: DC | PRN
  Administered 2023-03-08 (×2): 50 mg via INTRAVENOUS

## 2023-03-08 MED ORDER — DEXAMETHASONE SODIUM PHOSPHATE 10 MG/ML IJ SOLN
INTRAMUSCULAR | Status: AC
  Filled 2023-03-08: qty 1

## 2023-03-08 MED ORDER — ONDANSETRON HCL 4 MG/2ML IJ SOLN
INTRAMUSCULAR | Status: AC
  Filled 2023-03-08: qty 2

## 2023-03-08 MED ORDER — LIDOCAINE HCL (PF) 2 % IJ SOLN
INTRAMUSCULAR | Status: AC
Start: 2023-03-08 — End: ?
  Filled 2023-03-08: qty 5

## 2023-03-08 MED ORDER — FENTANYL CITRATE (PF) 100 MCG/2ML IJ SOLN
INTRAMUSCULAR | Status: AC
  Filled 2023-03-08: qty 2

## 2023-03-08 MED ORDER — PHENYLEPHRINE 80 MCG/ML (10ML) SYRINGE FOR IV PUSH (FOR BLOOD PRESSURE SUPPORT)
PREFILLED_SYRINGE | INTRAVENOUS | Status: DC | PRN
  Administered 2023-03-08: 80 ug via INTRAVENOUS
  Administered 2023-03-08 (×2): 160 ug via INTRAVENOUS
  Administered 2023-03-08: 80 ug via INTRAVENOUS
  Administered 2023-03-08: 160 ug via INTRAVENOUS

## 2023-03-08 MED ORDER — VASOPRESSIN 20 UNIT/ML IV SOLN
INTRAVENOUS | Status: DC | PRN
  Administered 2023-03-08: 1 [IU] via INTRAVENOUS

## 2023-03-08 MED ORDER — FENTANYL CITRATE PF 50 MCG/ML IJ SOSY: 25.0000 ug | PREFILLED_SYRINGE | INTRAMUSCULAR | Status: DC | PRN

## 2023-03-08 MED ORDER — DOCUSATE SODIUM 100 MG PO CAPS: 100.0000 mg | ORAL_CAPSULE | Freq: Two times a day (BID) | ORAL | 2 refills | Status: AC

## 2023-03-08 MED ORDER — SUGAMMADEX SODIUM 200 MG/2ML IV SOLN
INTRAVENOUS | Status: DC | PRN
  Administered 2023-03-08: 200 mg via INTRAVENOUS

## 2023-03-08 MED ORDER — BUPIVACAINE HCL (PF) 0.5 % IJ SOLN
INTRAMUSCULAR | Status: AC
  Filled 2023-03-08: qty 30

## 2023-03-08 MED ORDER — ONDANSETRON HCL 4 MG/2ML IJ SOLN
INTRAMUSCULAR | Status: DC | PRN
  Administered 2023-03-08: 4 mg via INTRAVENOUS

## 2023-03-08 MED ORDER — DEXAMETHASONE SODIUM PHOSPHATE 10 MG/ML IJ SOLN
INTRAMUSCULAR | Status: DC | PRN
  Administered 2023-03-08: 6 mg via INTRAVENOUS

## 2023-03-08 MED ORDER — ONDANSETRON HCL 4 MG/2ML IJ SOLN: 4.0000 mg | Freq: Once | INTRAMUSCULAR | Status: DC | PRN

## 2023-03-08 MED ORDER — OXYCODONE HCL 5 MG PO TABS: 5.0000 mg | ORAL_TABLET | Freq: Four times a day (QID) | ORAL | 0 refills | Status: DC | PRN

## 2023-03-08 MED ORDER — CHLORHEXIDINE GLUCONATE 0.12 % MT SOLN
OROMUCOSAL | Status: AC
  Administered 2023-03-08: 15 mL via OROMUCOSAL
  Filled 2023-03-08: qty 15

## 2023-03-08 MED ORDER — ORAL CARE MOUTH RINSE: 15.0000 mL | Freq: Once | OROMUCOSAL | Status: AC

## 2023-03-08 MED ORDER — EPHEDRINE 5 MG/ML INJ
INTRAVENOUS | Status: AC
  Filled 2023-03-08: qty 5

## 2023-03-08 MED ORDER — LIDOCAINE HCL (CARDIAC) PF 100 MG/5ML IV SOSY
PREFILLED_SYRINGE | INTRAVENOUS | Status: DC | PRN
  Administered 2023-03-08: 60 mg via INTRAVENOUS

## 2023-03-08 MED ORDER — CHLORHEXIDINE GLUCONATE CLOTH 2 % EX PADS
6.0000 | MEDICATED_PAD | Freq: Once | CUTANEOUS | Status: AC
  Administered 2023-03-08: 6 via TOPICAL

## 2023-03-08 MED ORDER — LACTATED RINGERS IV SOLN: INTRAVENOUS | Status: DC

## 2023-03-08 MED ORDER — VASOPRESSIN 20 UNIT/ML IV SOLN
INTRAVENOUS | Status: AC
  Filled 2023-03-08: qty 1

## 2023-03-08 MED ORDER — PROPOFOL 10 MG/ML IV BOLUS
INTRAVENOUS | Status: AC
Start: 2023-03-08 — End: ?
  Filled 2023-03-08: qty 20

## 2023-03-08 MED ORDER — ACETAMINOPHEN 500 MG PO TABS: 1000.0000 mg | ORAL_TABLET | Freq: Four times a day (QID) | ORAL | 0 refills | Status: AC

## 2023-03-08 MED ORDER — PROPOFOL 10 MG/ML IV BOLUS
INTRAVENOUS | Status: AC
  Filled 2023-03-08: qty 20

## 2023-03-08 MED ORDER — EPHEDRINE SULFATE-NACL 50-0.9 MG/10ML-% IV SOSY
PREFILLED_SYRINGE | INTRAVENOUS | Status: DC | PRN
  Administered 2023-03-08: 10 mg via INTRAVENOUS
  Administered 2023-03-08: 5 mg via INTRAVENOUS
  Administered 2023-03-08: 10 mg via INTRAVENOUS

## 2023-03-08 MED ORDER — 0.9 % SODIUM CHLORIDE (POUR BTL) OPTIME
TOPICAL | Status: DC | PRN
  Administered 2023-03-08: 500 mL

## 2023-03-08 MED ORDER — OXYCODONE HCL 5 MG PO TABS: 5.0000 mg | ORAL_TABLET | Freq: Once | ORAL | Status: DC | PRN

## 2023-03-08 MED ORDER — MIDAZOLAM HCL 2 MG/2ML IJ SOLN
INTRAMUSCULAR | Status: AC
  Filled 2023-03-08: qty 2

## 2023-03-08 MED ORDER — BUPIVACAINE HCL (PF) 0.5 % IJ SOLN
INTRAMUSCULAR | Status: DC | PRN
  Administered 2023-03-08 (×2): 10 mL

## 2023-03-08 MED ORDER — MIDAZOLAM HCL 2 MG/2ML IJ SOLN
INTRAMUSCULAR | Status: DC | PRN
  Administered 2023-03-08: 2 mg via INTRAVENOUS

## 2023-03-08 MED ORDER — LACTATED RINGERS IV SOLN: INTRAVENOUS | Status: DC | PRN

## 2023-03-08 MED ORDER — PROPOFOL 10 MG/ML IV BOLUS
INTRAVENOUS | Status: DC | PRN
  Administered 2023-03-08: 150 mg via INTRAVENOUS

## 2023-03-08 MED ORDER — OXYCODONE HCL 5 MG/5ML PO SOLN: 5.0000 mg | Freq: Once | ORAL | Status: DC | PRN

## 2023-03-08 MED ORDER — CHLORHEXIDINE GLUCONATE 0.12 % MT SOLN: 15.0000 mL | Freq: Once | OROMUCOSAL | Status: AC

## 2023-03-08 MED ORDER — ROCURONIUM BROMIDE 10 MG/ML (PF) SYRINGE
PREFILLED_SYRINGE | INTRAVENOUS | Status: AC
  Filled 2023-03-08: qty 10

## 2023-03-08 MED ORDER — CHLORHEXIDINE GLUCONATE CLOTH 2 % EX PADS: 6.0000 | MEDICATED_PAD | Freq: Once | CUTANEOUS | Status: DC

## 2023-03-08 MED ORDER — PHENYLEPHRINE 80 MCG/ML (10ML) SYRINGE FOR IV PUSH (FOR BLOOD PRESSURE SUPPORT)
PREFILLED_SYRINGE | INTRAVENOUS | Status: AC
  Filled 2023-03-08: qty 10

## 2023-03-08 MED ORDER — FENTANYL CITRATE (PF) 100 MCG/2ML IJ SOLN
INTRAMUSCULAR | Status: DC | PRN
  Administered 2023-03-08: 100 ug via INTRAVENOUS
  Administered 2023-03-08 (×2): 50 ug via INTRAVENOUS

## 2023-03-08 SURGICAL SUPPLY — 26 items
APPLICATOR CHLORAPREP 10.5 ORG (MISCELLANEOUS) ×2 IMPLANT
BLADE SURG 15 STRL LF DISP TIS (BLADE) IMPLANT
CLOTH BEACON ORANGE TIMEOUT ST (SAFETY) ×2 IMPLANT
COVER LIGHT HANDLE STERIS (MISCELLANEOUS) ×4 IMPLANT
DRAPE UTILITY W/TAPE 26X15 (DRAPES) IMPLANT
ELECT REM PT RETURN 9FT ADLT (ELECTROSURGICAL) ×2
ELECTRODE REM PT RTRN 9FT ADLT (ELECTROSURGICAL) ×2 IMPLANT
GAUZE SPONGE 4X4 12PLY STRL (GAUZE/BANDAGES/DRESSINGS) IMPLANT
GAUZE XEROFORM 1X8 LF (GAUZE/BANDAGES/DRESSINGS) IMPLANT
GLOVE BIOGEL PI IND STRL 6.5 (GLOVE) ×2 IMPLANT
GLOVE BIOGEL PI IND STRL 7.0 (GLOVE) ×4 IMPLANT
GLOVE SURG SS PI 6.5 STRL IVOR (GLOVE) ×4 IMPLANT
GOWN STRL REUS W/TWL LRG LVL3 (GOWN DISPOSABLE) ×4 IMPLANT
KIT TURNOVER KIT A (KITS) ×2 IMPLANT
MANIFOLD NEPTUNE II (INSTRUMENTS) ×2 IMPLANT
NDL HYPO 21X1.5 SAFETY (NEEDLE) IMPLANT
NEEDLE HYPO 21X1.5 SAFETY (NEEDLE) ×2
NS IRRIG 1000ML POUR BTL (IV SOLUTION) ×2 IMPLANT
PACK MINOR (CUSTOM PROCEDURE TRAY) IMPLANT
PAD ARMBOARD 7.5X6 YLW CONV (MISCELLANEOUS) ×2 IMPLANT
POSITIONER HEAD 8X9X4 ADT (SOFTGOODS) ×2 IMPLANT
SET BASIN LINEN APH (SET/KITS/TRAYS/PACK) ×2 IMPLANT
SUT ETHILON 3 0 FSL (SUTURE) IMPLANT
SUT VIC AB 3-0 SH 27X BRD (SUTURE) ×2 IMPLANT
SYR BULB IRRIG 60ML STRL (SYRINGE) IMPLANT
SYR CONTROL 10ML LL (SYRINGE) ×2 IMPLANT

## 2023-03-08 NOTE — Discharge Instructions (Signed)
 Ambulatory Surgery Discharge Instructions  General Anesthesia or Sedation Do not drive or operate heavy machinery for 24 hours.  Do not consume alcohol, tranquilizers, sleeping medications, or any non-prescribed medications for 24 hours. Do not make important decisions or sign any important papers in the next 24 hours. You should have someone with you tonight at home.  Activity  You are advised to go directly home from the hospital.  Restrict your activities and rest for a day.  Resume light activity tomorrow. No heavy lifting over 10 lbs or strenuous exercise.  Fluids and Diet Begin with clear liquids, bouillon, dry toast, soda crackers.  If not nauseated, you may go to a regular diet when you desire.  Greasy and spicy foods are not advised.  Medications  If you have not had a bowel movement in 24 hours, take 2 tablespoons over the counter Milk of mag.             You May resume your blood thinners tomorrow (Aspirin, coumadin, or other).  You are being discharged with prescriptions for Opioid/Narcotic Medications: There are some specific considerations for these medications that you should know. Opioid Meds have risks & benefits. Addiction to these meds is always a concern with prolonged use Take medication only as directed Do not drive while taking narcotic pain medication Do not crush tablets or capsules Do not use a different container than medication was dispensed in Lock the container of medication in a cool, dry place out of reach of children and pets. Opioid medication can cause addiction Do not share with anyone else (this is a felony) Do not store medications for future use. Dispose of them properly.     Disposal:  Find a [employer] household drug take back site near you.  If you can't get to a drug take back site, use the recipe below as a last resort to dispose of expired, unused or unwanted drugs. Disposal  (Do not dispose chemotherapy drugs this way, talk to your  prescribing doctor instead.) Step 1: Mix drugs (do not crush) with dirt, kitty litter, or used coffee grounds and add a small amount of water to dissolve any solid medications. Step 2: Seal drugs in plastic bag. Step 3: Place plastic bag in trash. Step 4: Take prescription container and scratch out personal information, then recycle or throw away.  Operative Site  You have external stitches in place.  These will be removed at your follow up visit. Ok to English as a second language teacher. Keep wound clean and dry. No baths or swimming. No lifting more than 10 pounds.  Contact Information: If you have questions or concerns, please call our office, [phone], Monday- Thursday 8AM-5PM and Friday 8AM-12Noon.  If it is after hours or on the weekend, please call [facility] Main Number, [phone], and ask to speak to the surgeon on call for Dr. Robyne Peers at [facility].   SPECIFIC COMPLICATIONS TO WATCH FOR: Inability to urinate Fever over 101? F by mouth Nausea and vomiting lasting longer than 24 hours. Pain not relieved by medication ordered Swelling around the operative site Increased redness, warmth, hardness, around operative area Numbness, tingling, or cold fingers or toes Blood -soaked dressing, (small amounts of oozing may be normal) Increasing and progressive drainage from surgical area or exam site

## 2023-03-08 NOTE — Anesthesia Preprocedure Evaluation (Signed)
Anesthesia Evaluation  Patient identified by MRN, date of birth, ID band Patient awake    Reviewed: Allergy & Precautions, H&P , NPO status , Patient's Chart, lab work & pertinent test results, reviewed documented beta blocker date and time   Airway Mallampati: II  TM Distance: >3 FB Neck ROM: full    Dental no notable dental hx.    Pulmonary asthma , COPD, former smoker   Pulmonary exam normal breath sounds clear to auscultation       Cardiovascular Exercise Tolerance: Good hypertension,  Rhythm:regular Rate:Normal     Neuro/Psych negative neurological ROS  negative psych ROS   GI/Hepatic Neg liver ROS,GERD  ,,  Endo/Other  negative endocrine ROS    Renal/GU negative Renal ROS  negative genitourinary   Musculoskeletal   Abdominal   Peds  Hematology  (+) Blood dyscrasia, anemia   Anesthesia Other Findings   Reproductive/Obstetrics negative OB ROS                             Anesthesia Physical Anesthesia Plan  ASA: 2  Anesthesia Plan: General and General ETT   Post-op Pain Management:    Induction:   PONV Risk Score and Plan: Ondansetron  Airway Management Planned:   Additional Equipment:   Intra-op Plan:   Post-operative Plan:   Informed Consent: I have reviewed the patients History and Physical, chart, labs and discussed the procedure including the risks, benefits and alternatives for the proposed anesthesia with the patient or authorized representative who has indicated his/her understanding and acceptance.     Dental Advisory Given  Plan Discussed with: CRNA  Anesthesia Plan Comments:        Anesthesia Quick Evaluation

## 2023-03-08 NOTE — Anesthesia Procedure Notes (Addendum)
Procedure Name: Intubation Date/Time: 03/08/2023 10:12 AM  Performed by: Oletha Cruel, CRNAPre-anesthesia Checklist: Patient identified, Emergency Drugs available, Suction available and Patient being monitored Patient Re-evaluated:Patient Re-evaluated prior to induction Oxygen Delivery Method: Circle system utilized Preoxygenation: Pre-oxygenation with 100% oxygen Induction Type: IV induction Ventilation: Mask ventilation without difficulty Laryngoscope Size: Mac and 4 Grade View: Grade II Tube type: Oral Tube size: 7.0 mm Number of attempts: 1 Airway Equipment and Method: Stylet Placement Confirmation: ETT inserted through vocal cords under direct vision, positive ETCO2, CO2 detector and breath sounds checked- equal and bilateral Secured at: 22 cm Tube secured with: Tape Dental Injury: Teeth and Oropharynx as per pre-operative assessment  Comments: Atraumatic intubation. Lips and teeth remain in preoperative condition.

## 2023-03-08 NOTE — Progress Notes (Signed)
Sent patient home with a pack of gauze and mipilex tape in case the dressing becomes wet or soiled.  Patient and patients wife instructed on how to take care of the surgical site.  Both voiced understanding to all teaching.

## 2023-03-08 NOTE — Progress Notes (Signed)
[facility]  Spoke with the patient's wife in the consultation room.  I explained that she tolerated the procedure without difficulty.  He has external stitches in place that will be removed in office at his follow-up visit.  I discharged him home with a prescription for narcotic pain medication that they should take as needed for pain.  I also want him taking scheduled Tylenol.  If they take the narcotic pain medication, they should take a stool softener as well.  The patient will follow-up with me in 2 weeks.  All questions were answered to her expressed satisfaction.  Melvin Kinds, DO [facility] [address] [place], [place] [address] [phone] (office)

## 2023-03-08 NOTE — Interval H&P Note (Signed)
History and Physical Interval Note:  03/08/2023 9:45 AM  [name]  has presented today for surgery, with the diagnosis of MASS/ CYST, POSTERIOR NECK 2.1- 3.0 CM MASS/ CYST, SHOULDER 2.1- 3.0 CM.  The various methods of treatment have been discussed with the patient and family. After consideration of risks, benefits and other options for treatment, the patient has consented to  Procedure(s): EXCISION MASS, SHOULDER (N/A) CYST REMOVAL NECK, POSTERIOR (N/A) as a surgical intervention.  The patient's history has been reviewed, patient examined, no change in status, stable for surgery.  I have reviewed the patient's chart and labs.  Questions were answered to the patient's satisfaction.     Ashtian Villacis A Eaden Hettinger

## 2023-03-08 NOTE — Op Note (Addendum)
[facility] Operative Note  03/08/23  Preoperative Diagnosis: Posterior neck mass, left shoulder cyst   Postoperative Diagnosis: Same   Procedure(s) Performed: Excision of posterior neck mass and left shoulder cyst   Surgeon: Theophilus Kinds, DO    Assistants: No qualified resident was available    Anesthesia: General endotracheal   Anesthesiologist: Dr. Johnnette Litter   Specimens: Posterior neck mass, left shoulder cyst   Estimated Blood Loss: Minimal   Blood Replacement: None    Complications: None   Wound Class: Contaminated   Operative Indications: Patient is a 67 year old male who presents for excision of a posterior neck mass and a left shoulder cyst.  The posterior neck mass is a recurrent mass that was previously excised, and the shoulder cyst has been increasing in size and started to cause him pain.  He would like them both excised at this time.  He is agreeable to surgery.  All risks and benefits of performing this procedure were discussed with the patient including pain, infection, bleeding, damage to the surrounding structures, and need for more procedures or surgery. The patient voiced understanding of the procedure, all questions were sought and answered, and consent was obtained.  Findings:  -Posterior neck mass with surrounding scar tissue from previous excision, 3 cm in size -Left shoulder cyst with surrounding inflammation, 2.5 cm in size   Procedure: The patient was taken to the operating room and placed supine. General endotracheal anesthesia was induced. Intravenous antibiotics were not administered, as they are not indicated.  The patient was then placed in a right lateral decubitus position.  The posterior neck and left shoulder were prepared and draped in the usual sterile fashion.   Attention was first turned to the posterior neck mass.  An elliptical incision was made overlying the mass. Using electrocautery, the dermis and subcutaneous  tissues dissected around the mass. The mass was removed in its entirety and sent to pathology for evaluation.  There was a fair amount of scar tissue noted around the mass. Hemostasis was achieved using electrocautery.  The area was localized with Marcaine.  The dermis was approximated using 3-0 Vicryl sutures.  The skin was closed with 3-0 nylon in a vertical mattress fashion.  Attention was then turned to the left posterior shoulder cyst.  A linear incision was made overlying the mass.  Using electrocautery, the dermis and subcutaneous tissues were dissected around the mass.  The mass was removed in its entirety and sent to pathology for evaluation.  There was some surrounding inflammatory changes noted in this area.  Hemostasis was achieved with electrocautery.  The area was localized with Marcaine.  The dermis was approximated using 3-0 Vicryl sutures.  The skin was closed with 3-0 nylon in a vertical mattress fashion.  Both incision sites were dressed with Xeroform, 4 x 4's, and Medipore tape.  Final inspection revealed acceptable hemostasis. All counts were correct at the end of the case. The patient was awakened from anesthesia and extubated without complication.  The patient went to the PACU in stable condition.   Theophilus Kinds, DO  [facility] [address] [place], [place] [address] [phone] (office)

## 2023-03-08 NOTE — Transfer of Care (Signed)
Immediate Anesthesia Transfer of Care Note  Patient: RYAAN BATTAGLIA  Procedure(s) Performed: EXCISION MASS, SHOULDER (Left: Shoulder) CYST REMOVAL NECK, POSTERIOR (Neck)  Patient Location: PACU  Anesthesia Type:General  Level of Consciousness: drowsy and patient cooperative  Airway & Oxygen Therapy: Patient Spontanous Breathing and Patient connected to nasal cannula oxygen  Post-op Assessment: Report given to RN and Post -op Vital signs reviewed and stable  Post vital signs: Reviewed and stable  Last Vitals:  Vitals Value Taken Time  BP 158/83 03/08/23 1200  Temp 36.8 C 03/08/23 1155  Pulse 51 03/08/23 1203  Resp 11 03/08/23 1203  SpO2 100 % 03/08/23 1203  Vitals shown include unfiled device data.  Last Pain:  Vitals:   03/08/23 1200  TempSrc:   PainSc: 0-No pain         Complications: No notable events documented.

## 2023-03-10 NOTE — Anesthesia Postprocedure Evaluation (Signed)
Anesthesia Post Note  Patient: [name]  Procedure(s) Performed: EXCISION MASS, SHOULDER (Left: Shoulder) CYST REMOVAL NECK, POSTERIOR (Neck)  Patient location during evaluation: Phase II Anesthesia Type: General Level of consciousness: awake Pain management: pain level controlled Vital Signs Assessment: post-procedure vital signs reviewed and stable Respiratory status: spontaneous breathing and respiratory function stable Cardiovascular status: blood pressure returned to baseline and stable Postop Assessment: no headache and no apparent nausea or vomiting Anesthetic complications: no Comments: Late entry   No notable events documented.   Last Vitals:  Vitals:   03/08/23 1245 03/08/23 1252  BP: (!) 150/86 (!) 156/89  Pulse: 70 76  Resp: 18 (!) 22  Temp:  36.7 C  SpO2: 96% 96%    Last Pain:  Vitals:   03/08/23 1252  TempSrc:   PainSc: 0-No pain                 Windell Norfolk

## 2023-03-11 LAB — SURGICAL PATHOLOGY

## 2023-03-12 ENCOUNTER — Encounter (HOSPITAL_COMMUNITY): Payer: Self-pay | Admitting: Surgery

## 2023-03-28 ENCOUNTER — Ambulatory Visit (INDEPENDENT_AMBULATORY_CARE_PROVIDER_SITE_OTHER): Payer: Medicare HMO | Admitting: Surgery

## 2023-03-28 ENCOUNTER — Encounter: Payer: Self-pay | Admitting: Surgery

## 2023-03-28 VITALS — BP 139/87 | HR 75 | Temp 97.9°F | Resp 18 | Wt 129.6 lb

## 2023-03-28 DIAGNOSIS — Z09 Encounter for follow-up examination after completed treatment for conditions other than malignant neoplasm: Secondary | ICD-10-CM

## 2023-03-28 NOTE — Progress Notes (Signed)
 [facility] Note   HPI:  68 y.o. Male presents to clinic for post-op follow-up status post excision of posterior neck mass and left shoulder cyst on 12/13.  Patient has overall been doing well since the procedure.  He is a little sore at his incision site on his neck and has been taking Tylenol  twice daily.  He denies any issues with his incision sites.  Denies drainage.  He also denies fevers and chills.  Review of Systems:  All other review of systems: otherwise negative   Vital Signs:  BP 139/87 (BP Location: Right Arm)   Pulse 75   Temp 97.9 F (36.6 C) (Oral)   Resp 18   Wt 129 lb 9.6 oz (58.8 kg)   SpO2 93%   BMI 19.14 kg/m    Physical Exam:  Physical Exam Vitals reviewed.  Constitutional:      Appearance: Normal appearance.  Skin:    Comments: Left shoulder cyst incision site with sutures in place, healing well with an associated overlying scab, nontender to palpation, no erythema or induration; posterior neck incision site with sutures in place and healing well, mild underlying palpable fluid but no surrounding erythema, induration, or tenderness  Neurological:     Mental Status: He is alert.     Laboratory studies: None   Imaging:  None   Pathology: A. NECK MASS, POSTERIOR, EXCISION:       Benign soft tissue with fibrosis and scarring.       Negative for malignancy.   B. LEFT SHOULDER CYST, EXCISION:       Epidermal inclusion cyst with rupture.       Negative for malignancy.   Assessment:  68 y.o. yo Male who presents for follow-up status post excision of posterior neck mass and left shoulder cyst on 12/13.  Plan:  -Patient overall doing well since the procedure, and incisions healing well -Sutures removed, and soreness at posterior neck incision significantly improved after removal of sutures -Advised that he can apply antibiotic ointment to the incision sites after showering -Follow up as needed  All of the above recommendations were  discussed with the patient, and all of patient's questions were answered to his expressed satisfaction.  Dorothyann Brittle, DO [facility] [address] [place], [place] [address] [phone] (office)

## 2023-04-10 ENCOUNTER — Telehealth: Payer: Self-pay | Admitting: Urology

## 2023-04-10 NOTE — Telephone Encounter (Signed)
**Note De-identified  [place] Obfuscation** Please advise 

## 2023-04-10 NOTE — Telephone Encounter (Signed)
 Patient states he thinks he has another UTI again, he is having the pain and burning, he would like a antibiotic called into [facility] in [place] , this has been going on for about a week now.   Scheduled patient for Monday to see Dr Derrick Fling, he would prefer to just have antibiotic called in instead.

## 2023-04-12 NOTE — Telephone Encounter (Signed)
Called Pt to confirm appt for Monday 01/20

## 2023-04-15 ENCOUNTER — Encounter: Payer: Self-pay | Admitting: Urology

## 2023-04-15 ENCOUNTER — Ambulatory Visit (INDEPENDENT_AMBULATORY_CARE_PROVIDER_SITE_OTHER): Payer: Medicare HMO | Admitting: Urology

## 2023-04-15 VITALS — BP 145/84 | HR 61

## 2023-04-15 DIAGNOSIS — N3000 Acute cystitis without hematuria: Secondary | ICD-10-CM

## 2023-04-15 DIAGNOSIS — N3001 Acute cystitis with hematuria: Secondary | ICD-10-CM

## 2023-04-15 DIAGNOSIS — R3129 Other microscopic hematuria: Secondary | ICD-10-CM

## 2023-04-15 LAB — URINALYSIS, ROUTINE W REFLEX MICROSCOPIC
Bilirubin, UA: NEGATIVE
Glucose, UA: NEGATIVE
Ketones, UA: NEGATIVE
Nitrite, UA: NEGATIVE
Specific Gravity, UA: 1.03 (ref 1.005–1.030)
Urobilinogen, Ur: 0.2 mg/dL (ref 0.2–1.0)
pH, UA: 6 (ref 5.0–7.5)

## 2023-04-15 LAB — MICROSCOPIC EXAMINATION: WBC, UA: >30 /[HPF] — AB (ref 0–5)

## 2023-04-15 MED ORDER — NITROFURANTOIN MONOHYD MACRO 100 MG PO CAPS: ORAL_CAPSULE | ORAL | 0 refills | Status: AC

## 2023-04-15 NOTE — Progress Notes (Signed)
04/15/2023 11:41 AM   [name] 10/04/1955 [id number]  Referring provider: Alvina Filbert, MD [address] [place],  [place] [address]  No chief complaint on file.   HPI:  F/u -    1) BPH - c/o intermittent symptoms of hesitancy, intermittent stream, decreased force of stream, and nocturia 1-2 times.  No dysuria or gross hematuria.  He previously tried tamsulosin without improvement in his symptoms.  No history of UTIs or prostatitis. IPSS = 16. He started tamsulosin Sep 2024. IPSS 15.    03/22 CT benign, prostate ~ 30 g. PSA from 10/22: 2.4 with 25% free.    2) UTI - Sep 2024 - dysuria, hesistancy and cloudy urine for past 2-3 weeks. No gross hematuria. No fevers. PVR 84 ml. UA many bacteria, > 30 wbc and 3-10 rbc.    Today, seen for the above. His Oct 2024 PSA was 3.9.  Added on for dysuria. He noted return of dysuria and urgency. Abx helped in the past. UA bacteria and RBCs.    He was in textiles. Made yarn.    PMH: Past Medical History:  Diagnosis Date   Asthma    Bronchitis    COPD (chronic obstructive pulmonary disease) (HCC)    GERD (gastroesophageal reflux disease)    Hypercholesteremia    Hypertension    S/P endoscopy July 21, 2010   Dr. Jena Gauss: limited endoscopy, secondary to food impaction, Schatzki's ring seen    Surgical History: Past Surgical History:  Procedure Laterality Date   BIOPSY  06/28/2020   Procedure: BIOPSY;  Surgeon: Dolores Frame, MD;  [facility];  Service: Gastroenterology;;   COLONOSCOPY N/A 03/22/2016   Procedure: COLONOSCOPY;  Surgeon: Malissa Hippo, MD;  [facility]: [facility];  Service: Endoscopy;  Laterality: N/A;  830   COLONOSCOPY N/A 07/08/2019   Procedure: COLONOSCOPY;  Surgeon: Malissa Hippo, MD;  [facility]: [facility];  Service: Endoscopy;  Laterality: N/A;  1200   CYST REMOVAL NECK N/A 03/08/2023   Procedure: CYST REMOVAL NECK, POSTERIOR;  Surgeon: Lewie Chamber, DO;  Location:  [facility];  Service: General;  Laterality: N/A;   ESOPHAGOGASTRODUODENOSCOPY (EGD) WITH PROPOFOL N/A 06/28/2020   Procedure: ESOPHAGOGASTRODUODENOSCOPY (EGD) WITH PROPOFOL;  Surgeon: Dolores Frame, MD;  [facility]: [facility];  Service: Gastroenterology;  Laterality: N/A;  PM   lipoma removal from posterior neck     MASS EXCISION Right 07/19/2021   Procedure: EXCISION MASS RIGHT POSTERIOR EAR;  Surgeon: Lewie Chamber, DO;  Location: [facility];  Service: General;  Laterality: Right;   MASS EXCISION Left 03/08/2023   Procedure: EXCISION MASS, SHOULDER;  Surgeon: Lewie Chamber, DO;  Location: [facility];  Service: General;  Laterality: Left;    Home Medications:  Allergies as of 04/15/2023       Reactions   American Cockroach Itching   Chocolate Other (See Comments)   reflux   Corn-containing Products Itching   Dust Mite Extract Itching   Grass Pollen(k-o-r-t-swt Vern) Itching   Onion Itching   Other Itching   Squash and Tomatoes cause itching and whelps.    Peanut-containing Drug Products Itching   Itching and Whelps   Pollen Extract-tree Extract [pollen Extract] Itching        Medication List        Accurate as of April 15, 2023 11:41 AM. If you have any questions, ask your nurse or doctor.  albuterol 108 (90 Base) MCG/ACT inhaler Commonly known as: VENTOLIN HFA Inhale 1 puff into the lungs every 6 (six) hours as needed for wheezing or shortness of breath.   albuterol (2.5 MG/3ML) 0.083% nebulizer solution Commonly known as: PROVENTIL Take 3 mLs (2.5 mg total) by nebulization every 6 (six) hours as needed. For shortness of breath and/or wheezing   amLODipine 5 MG tablet Commonly known as: NORVASC Take 5 mg by mouth daily.   azelastine 0.05 % ophthalmic solution Commonly known as: OPTIVAR Place 1 drop into both eyes 2 (two) times daily.   docusate sodium 100 MG capsule Commonly known as: Colace Take 1 capsule (100 mg total) by mouth  2 (two) times daily.   EPINEPHrine 0.3 mg/0.3 mL Soaj injection Commonly known as: EPI-PEN Inject 0.3 mg into the muscle as needed for anaphylaxis.   OVER THE COUNTER MEDICATION Allergy shots two shots Q Wednesday per patient for allergy to grass, cockroaches,dust mites, and trees.   oxyCODONE 5 MG immediate release tablet Commonly known as: Roxicodone Take 1 tablet (5 mg total) by mouth every 6 (six) hours as needed.   oxymetazoline 0.05 % nasal spray Commonly known as: AFRIN Place 1 spray into both nostrils 2 (two) times daily as needed for congestion.   sildenafil 50 MG tablet Commonly known as: VIAGRA Take 50 mg by mouth daily as needed for erectile dysfunction.   tamsulosin 0.4 MG Caps capsule Commonly known as: FLOMAX Take 1 capsule (0.4 mg total) by mouth daily after supper.   Trelegy Ellipta 200-62.5-25 MCG/INH Aepb Generic drug: Fluticasone-Umeclidin-Vilant Inhale 1 puff into the lungs daily.        Allergies:  Allergies  Allergen Reactions   American Cockroach Itching   Chocolate Other (See Comments)    reflux   Corn-Containing Products Itching   Dust Mite Extract Itching   Grass Pollen(K-O-R-T-Swt Vern) Itching   Onion Itching   Other Itching    Squash and Tomatoes cause itching and whelps.    Peanut-Containing Drug Products Itching    Itching and Whelps   Pollen Extract-Tree Extract [Pollen Extract] Itching    Family History: Family History  Problem Relation Age of Onset   Bone cancer Mother        deceased in late 45s   Aneurysm Father        deceased late 61s, brain   Cancer Brother    COPD Sister    Colon cancer Neg Hx     Social History:  reports that he quit smoking about 6 years ago. His smoking use included cigarettes. He quit smokeless tobacco use about 13 years ago. He reports that he does not currently use alcohol. He reports current drug use. Frequency: 2.00 times per week. Drug: Marijuana.   Physical Exam: BP (!) 145/84    Pulse 61   Constitutional:  Alert and oriented, No acute distress. HEENT: [place] AT, moist mucus membranes.  Trachea midline, no masses. Cardiovascular: No clubbing, cyanosis, or edema. Respiratory: Normal respiratory effort, no increased work of breathing. GI: Abdomen is soft, nontender, nondistended, no abdominal masses GU: No CVA tenderness Skin: No rashes, bruises or suspicious lesions. Neurologic: Grossly intact, no focal deficits, moving all 4 extremities. Psychiatric: Normal mood and affect.  Laboratory Data: Lab Results  Component Value Date   WBC 3.3 (L) 03/07/2023   HGB 13.2 03/07/2023   HCT 38.9 (L) 03/07/2023   MCV 93.7 03/07/2023   PLT 233 03/07/2023    Lab Results  Component Value Date  CREATININE 0.74 06/19/2020    No results found for: "PSA"  No results found for: "TESTOSTERONE"  No results found for: "HGBA1C"  Urinalysis    Component Value Date/Time   COLORURINE YELLOW 03/29/2014 2045   APPEARANCEUR Clear 01/14/2023 1341   LABSPEC >1.030 (H) 03/29/2014 2045   PHURINE 6.0 03/29/2014 2045   GLUCOSEU Negative 01/14/2023 1341   HGBUR TRACE (A) 03/29/2014 2045   BILIRUBINUR Negative 01/14/2023 1341   KETONESUR NEGATIVE 03/29/2014 2045   PROTEINUR Negative 01/14/2023 1341   PROTEINUR NEGATIVE 03/29/2014 2045   UROBILINOGEN 0.2 03/29/2014 2045   NITRITE Negative 01/14/2023 1341   NITRITE NEGATIVE 03/29/2014 2045   LEUKOCYTESUR Negative 01/14/2023 1341    Lab Results  Component Value Date   LABMICR Comment 01/14/2023   WBCUA >30 (A) 12/10/2022   LABEPIT 0-10 12/10/2022   BACTERIA Many (A) 12/10/2022    Pertinent Imaging: N/a   Assessment & Plan:    1. Acute cystitis without hematuria (Primary) Send urine for cx. Start abx and continue through cystoscopy.  - Urinalysis, Routine w reflex microscopic  2> microhematuria - likely from UTi but recurred. Check CT scan and cystoscopy   No follow-ups on file.  Jerilee Field, MD  [facility]  [address] [place], [place] [address] [phone]

## 2023-04-18 LAB — URINE CULTURE

## 2023-04-19 ENCOUNTER — Telehealth: Payer: Self-pay

## 2023-04-19 ENCOUNTER — Other Ambulatory Visit: Payer: Self-pay

## 2023-04-19 DIAGNOSIS — N138 Other obstructive and reflux uropathy: Secondary | ICD-10-CM

## 2023-04-19 MED ORDER — CEPHALEXIN 500 MG PO CAPS: 500.0000 mg | ORAL_CAPSULE | Freq: Two times a day (BID) | ORAL | 0 refills | Status: DC

## 2023-04-19 NOTE — Telephone Encounter (Signed)
-----   Message from Jerilee Field sent at 04/18/2023  5:38 PM EST ----- Let Gery Pray know we need to switch up his antibiotics for a few days. Send in cephalexin 500 mg, one po BID for 5 days. No refill. Tell him to stop the nitrofurantoin while on cephalexin but restart it and take one nitrofurantoin nightly until I see him back. Thanks! ----- Message ----- From: Sarajane Jews, CMA Sent: 04/18/2023   3:43 PM EST To: Jerilee Field, MD  Please review.

## 2023-04-19 NOTE — Telephone Encounter (Signed)
Called Pt to relay message from MD and let him know the Rx was sent to [facility] he said okay and will call pharmacy to check when its ready Pt will call back if he cannot afford Rx

## 2023-05-31 ENCOUNTER — Ambulatory Visit (HOSPITAL_COMMUNITY)
Admission: RE | Admit: 2023-05-31 | Discharge: 2023-05-31 | Disposition: A | Discharge status: Home / Self Care | Payer: Medicare HMO | Source: Ambulatory Visit | Attending: Urology | Admitting: Urology

## 2023-05-31 DIAGNOSIS — R3129 Other microscopic hematuria: Secondary | ICD-10-CM | POA: Diagnosis present

## 2023-05-31 LAB — POCT I-STAT CREATININE: Creatinine, Ser: 1.1 mg/dL (ref 0.61–1.24)

## 2023-05-31 MED ORDER — IOHEXOL 300 MG/ML  SOLN
125.0000 mL | Freq: Once | INTRAMUSCULAR | Status: AC | PRN
  Administered 2023-05-31: 125 mL via INTRAVENOUS

## 2023-06-10 ENCOUNTER — Other Ambulatory Visit: Payer: Medicare HMO | Admitting: Urology

## 2023-06-25 ENCOUNTER — Telehealth: Payer: Self-pay

## 2023-06-25 NOTE — Telephone Encounter (Signed)
-----   Message from Jerilee Field sent at 06/25/2023  2:13 PM EDT ----- CT benign. Keep cystoscopy 5/5 as planned. Thanks ----- Message ----- From: Sarajane Jews, CMA Sent: 06/25/2023   8:13 AM EDT To: Jerilee Field, MD  Please review.

## 2023-06-25 NOTE — Telephone Encounter (Signed)
 Called to relay result from MD to Pt voiced understanding

## 2023-07-15 ENCOUNTER — Ambulatory Visit: Payer: Medicare HMO | Admitting: Urology

## 2023-07-29 ENCOUNTER — Ambulatory Visit (INDEPENDENT_AMBULATORY_CARE_PROVIDER_SITE_OTHER): Admitting: Urology

## 2023-07-29 VITALS — BP 152/84 | HR 62

## 2023-07-29 DIAGNOSIS — N401 Enlarged prostate with lower urinary tract symptoms: Secondary | ICD-10-CM

## 2023-07-29 DIAGNOSIS — R300 Dysuria: Secondary | ICD-10-CM

## 2023-07-29 DIAGNOSIS — R3129 Other microscopic hematuria: Secondary | ICD-10-CM | POA: Diagnosis not present

## 2023-07-29 LAB — URINALYSIS, ROUTINE W REFLEX MICROSCOPIC
Bilirubin, UA: NEGATIVE
Glucose, UA: NEGATIVE
Ketones, UA: NEGATIVE
Leukocytes,UA: NEGATIVE
Nitrite, UA: NEGATIVE
Protein,UA: NEGATIVE
RBC, UA: NEGATIVE
Specific Gravity, UA: 1.015 (ref 1.005–1.030)
Urobilinogen, Ur: 0.2 mg/dL (ref 0.2–1.0)
pH, UA: 6 (ref 5.0–7.5)

## 2023-07-29 MED ORDER — TAMSULOSIN HCL 0.4 MG PO CAPS: 0.4000 mg | ORAL_CAPSULE | Freq: Every day | ORAL | 3 refills | Status: DC

## 2023-07-29 NOTE — Progress Notes (Unsigned)
   07/29/23  CC: No chief complaint on file.   HPI:  F/u -    1) BPH - c/o intermittent symptoms of hesitancy, intermittent stream, decreased force of stream, and nocturia 1-2 times.  No dysuria or gross hematuria.  He previously tried tamsulosin  without improvement in his symptoms.  No history of UTIs or prostatitis. IPSS = 16. He started tamsulosin  Sep 2024. IPSS 15.    03/22 CT benign, prostate ~ 30 g. PSA from 2022 - PSA 2.4 with 25% free. His Oct 2024 PSA was 3.9. Mar 2025 CT - benign. Mod BPH with small median lobe/intravesical extension/high BN - prostate ~ 40 g. PUL 4 cm.    2) UTI - Sep 2024 - dysuria, hesistancy and cloudy urine for past 2-3 weeks. No gross hematuria. No fevers. PVR 84 ml. UA many bacteria, > 30 wbc and 3-10 rbc. Had dysuria. He noted return of dysuria and urgency. Abx helped in the past. Urine cx grew > 100k strep. UA bacteria and RBCs. Mar 2025 CT - benign. No stone, hydro. BPH.   Today, seen for cystoscopy. UA negative. IPSS 17. Rx for tamsulosin  Sep 2024, but he hasn't started.    He was in textiles. Made yarn.   Blood pressure (!) 152/84, pulse 62. NED. A&Ox3.   No respiratory distress   Abd soft, NT, ND Normal phallus with bilateral descended testicles  Cystoscopy Procedure Note  Patient identification was confirmed, informed consent was obtained, and patient was prepped using Betadine solution.  Lidocaine  jelly was administered per urethral meatus.     Pre-Procedure: - Inspection reveals a normal caliber ureteral meatus.  Procedure: The flexible cystoscope was introduced without difficulty - Urethra - Mild bulb stricture scope dilated. No lesions.  - moderate lateral lobes, obstructing prostate  - elevated bladder neck - Bilateral ureteral orifices identified - Bladder mucosa  reveals no ulcers, tumors, or lesions - No bladder stones -moderate trabeculation and cellules  Retroflexion shows normal BN but small median lobe/intravesical  extension/high BN.    Post-Procedure: - Patient tolerated the procedure well  Assessment/ Plan:   MH - benign eval   BPH, LUTS- discussed alpha blocker. Add 5ari. Procedures such as WVTT, itind, OPL or laser ablation. He will try tamsulosin  again.    Christina Coyer, MD

## 2023-07-30 ENCOUNTER — Telehealth: Payer: Self-pay

## 2023-07-31 NOTE — Telephone Encounter (Signed)
 Called pt to relay message from MD "No, I hope he won't need it once he restarts the tamsulosin ." See previous encounters for details

## 2023-10-28 ENCOUNTER — Ambulatory Visit: Admitting: Urology

## 2023-11-26 ENCOUNTER — Other Ambulatory Visit: Payer: Self-pay

## 2023-11-26 ENCOUNTER — Telehealth: Payer: Self-pay

## 2023-11-26 DIAGNOSIS — N138 Other obstructive and reflux uropathy: Secondary | ICD-10-CM

## 2023-11-26 NOTE — Telephone Encounter (Signed)
 Patient appointment was rescheduled.   He is out of: tamsulosin  (FLOMAX ) 0.4 MG CAPS capsule   Pharmacy:  [facility] [address] [place] - [address] [place] [address] Phone: [phone]  Fax: [phone]

## 2023-11-26 NOTE — Telephone Encounter (Signed)
 Called pt to notify him a Rx refill request has been sent to Dr. Nieves pt voiced his understanding

## 2023-11-27 MED ORDER — TAMSULOSIN HCL 0.4 MG PO CAPS: 0.4000 mg | ORAL_CAPSULE | Freq: Every day | ORAL | 3 refills | Status: DC

## 2023-12-23 ENCOUNTER — Encounter: Payer: Self-pay | Admitting: Urology

## 2023-12-23 ENCOUNTER — Ambulatory Visit: Admitting: Urology

## 2023-12-23 VITALS — BP 136/79 | HR 76

## 2023-12-23 DIAGNOSIS — R3912 Poor urinary stream: Secondary | ICD-10-CM | POA: Diagnosis not present

## 2023-12-23 DIAGNOSIS — N401 Enlarged prostate with lower urinary tract symptoms: Secondary | ICD-10-CM

## 2023-12-23 DIAGNOSIS — N138 Other obstructive and reflux uropathy: Secondary | ICD-10-CM | POA: Diagnosis not present

## 2023-12-23 DIAGNOSIS — N5201 Erectile dysfunction due to arterial insufficiency: Secondary | ICD-10-CM | POA: Diagnosis not present

## 2023-12-23 NOTE — Progress Notes (Addendum)
 12/23/2023 3:17 PM   [name] [birth year] [id number]  Referring provider: Katrinka Aquas, MD [address] [place],  [place] [address]  No chief complaint on file.   HPI:  F/u -    1) BPH - c/o intermittent symptoms of hesitancy, intermittent stream, decreased force of stream, and nocturia 1-2 times.  No dysuria or gross hematuria.  He previously tried tamsulosin  without improvement in his symptoms.  No history of UTIs or prostatitis. IPSS = 16. He started tamsulosin  Sep 2024. IPSS 15.    03/22 CT benign, prostate ~ 30 g. PSA from 2022 - PSA 2.4 with 25% free. His Oct 2024 PSA was 3.9. Mar 2025 CT - benign. Mod BPH with small median lobe/intravesical extension/high BN - prostate ~ 40 g. PUL 4 cm.  IPSS was 17. May 2025 cysto showed  small median lobe/intravesical extension/high BN, lateral lobe BOO.    2) UTI - Sep 2024 - dysuria, hesistancy and cloudy urine for past 2-3 weeks. No gross hematuria. No fevers. PVR 84 ml. UA many bacteria, > 30 wbc and 3-10 rbc. Had dysuria. He noted return of dysuria and urgency. Abx helped in the past. Urine cx grew > 100k strep. UA bacteria and RBCs. Mar 2025 CT - benign. No stone, hydro. BPH.   3) ED - takes sildenafil    Today, seen for the above. Rx for tamsulosin  Sep 2024, but he hasn't started.  Prescribed again in May 2025. He is taking tamsulosin  and doing well. Due for a PSA.   He was in textiles. Made yarn.    PMH: Past Medical History:  Diagnosis Date   Asthma    Bronchitis    COPD (chronic obstructive pulmonary disease) (HCC)    GERD (gastroesophageal reflux disease)    Hypercholesteremia    Hypertension    S/P endoscopy July 21, 2010   Dr. Shaaron: limited endoscopy, secondary to food impaction, Schatzki's ring seen    Surgical History: Past Surgical History:  Procedure Laterality Date   BIOPSY  06/28/2020   Procedure: BIOPSY;  Surgeon: Eartha Angelia Sieving, MD;  [facility];  Service: Gastroenterology;;    COLONOSCOPY N/A 03/22/2016   Procedure: COLONOSCOPY;  Surgeon: Claudis RAYMOND Rivet, MD;  [facility];  Service: Endoscopy;  Laterality: N/A;  830   COLONOSCOPY N/A 07/08/2019   Procedure: COLONOSCOPY;  Surgeon: Rivet Claudis RAYMOND, MD;  [facility];  Service: Endoscopy;  Laterality: N/A;  1200   CYST REMOVAL NECK N/A 03/08/2023   Procedure: CYST REMOVAL NECK, POSTERIOR;  Surgeon: Evonnie Dorothyann LABOR, DO;  Location: [facility];  Service: General;  Laterality: N/A;   ESOPHAGOGASTRODUODENOSCOPY (EGD) WITH PROPOFOL  N/A 06/28/2020   Procedure: ESOPHAGOGASTRODUODENOSCOPY (EGD) WITH PROPOFOL ;  Surgeon: Eartha Angelia Sieving, MD;  [facility];  Service: Gastroenterology;  Laterality: N/A;  PM   lipoma removal from posterior neck     MASS EXCISION Right 07/19/2021   Procedure: EXCISION MASS RIGHT POSTERIOR EAR;  Surgeon: Evonnie Dorothyann LABOR, DO;  Location: [facility];  Service: General;  Laterality: Right;   MASS EXCISION Left 03/08/2023   Procedure: EXCISION MASS, SHOULDER;  Surgeon: Evonnie Dorothyann LABOR, DO;  Location: [facility];  Service: General;  Laterality: Left;    Home Medications:  Allergies as of 12/23/2023       Reactions   American Cockroach Itching   Corn-containing Products Itching   Dust Mite Extract Itching   Grass Pollen(k-o-r-t-swt Vern) Itching   Peanut-containing Drug  Products Itching   Itching and Whelps   Pollen Extract-tree Extract [pollen Extract] Itching        Medication List        Accurate as of December 23, 2023  3:17 PM. If you have any questions, ask your nurse or doctor.          albuterol  108 (90 Base) MCG/ACT inhaler Commonly known as: VENTOLIN  HFA Inhale 1 puff into the lungs every 6 (six) hours as needed for wheezing or shortness of breath.   albuterol  (2.5 MG/3ML) 0.083% nebulizer solution Commonly known as: PROVENTIL  Take 3 mLs (2.5 mg total) by nebulization every 6 (six) hours as needed. For shortness of  breath and/or wheezing   amLODipine 5 MG tablet Commonly known as: NORVASC Take 5 mg by mouth daily.   azelastine 0.05 % ophthalmic solution Commonly known as: OPTIVAR Place 1 drop into both eyes 2 (two) times daily.   cephALEXin  500 MG capsule Commonly known as: Keflex  Take 1 capsule (500 mg total) by mouth 2 (two) times daily.   docusate sodium  100 MG capsule Commonly known as: Colace Take 1 capsule (100 mg total) by mouth 2 (two) times daily.   EPINEPHrine 0.3 mg/0.3 mL Soaj injection Commonly known as: EPI-PEN Inject 0.3 mg into the muscle as needed for anaphylaxis.   nitrofurantoin  (macrocrystal-monohydrate) 100 MG capsule Commonly known as: Macrobid  Take 100 mg twice a day for 5 days and the one nightly to prevent UTI   OVER THE COUNTER MEDICATION Allergy shots two shots Q Wednesday per patient for allergy to grass, cockroaches,dust mites, and trees.   oxyCODONE  5 MG immediate release tablet Commonly known as: Roxicodone  Take 1 tablet (5 mg total) by mouth every 6 (six) hours as needed.   oxymetazoline 0.05 % nasal spray Commonly known as: AFRIN Place 1 spray into both nostrils 2 (two) times daily as needed for congestion.   sildenafil 50 MG tablet Commonly known as: VIAGRA Take 50 mg by mouth daily as needed for erectile dysfunction.   tamsulosin  0.4 MG Caps capsule Commonly known as: FLOMAX  Take 1 capsule (0.4 mg total) by mouth daily after supper.   Trelegy Ellipta 200-62.5-25 MCG/INH Aepb Generic drug: Fluticasone -Umeclidin-Vilant Inhale 1 puff into the lungs daily.        Allergies:  Allergies  Allergen Reactions   American Cockroach Itching   Corn-Containing Products Itching   Dust Mite Extract Itching   Grass Pollen(K-O-R-T-Swt Vern) Itching   Peanut-Containing Drug Products Itching    Itching and Whelps   Pollen Extract-Tree Extract [Pollen Extract] Itching    Family History: Family History  Problem Relation Age of Onset   Bone cancer  Mother        deceased in late 64s   Aneurysm Father        deceased late 59s, brain   Cancer Brother    COPD Sister    Colon cancer Neg Hx     Social History:  reports that he quit smoking about 6 years ago. His smoking use included cigarettes. He quit smokeless tobacco use about 13 years ago. He reports that he does not currently use alcohol. He reports current drug use. Frequency: 2.00 times per week. Drug: Marijuana.   Physical Exam: There were no vitals taken for this visit.  Constitutional:  Alert and oriented, No acute distress. HEENT: [place] AT, moist mucus membranes.  Trachea midline, no masses. Cardiovascular: No clubbing, cyanosis, or edema. Respiratory: Normal respiratory effort, no increased work of breathing. GI:  Abdomen is soft, nontender, nondistended, no abdominal masses GU: No CVA tenderness Skin: No rashes, bruises or suspicious lesions. Neurologic: Grossly intact, no focal deficits, moving all 4 extremities. Psychiatric: Normal mood and affect.  Laboratory Data: Lab Results  Component Value Date   WBC 3.3 (L) 03/07/2023   HGB 13.2 03/07/2023   HCT 38.9 (L) 03/07/2023   MCV 93.7 03/07/2023   PLT 233 03/07/2023    Lab Results  Component Value Date   CREATININE 1.10 05/31/2023    No results found for: PSA  No results found for: TESTOSTERONE  No results found for: HGBA1C  Urinalysis    Component Value Date/Time   COLORURINE YELLOW 03/29/2014 2045   APPEARANCEUR Clear 07/29/2023 1050   LABSPEC >1.030 (H) 03/29/2014 2045   PHURINE 6.0 03/29/2014 2045   GLUCOSEU Negative 07/29/2023 1050   HGBUR TRACE (A) 03/29/2014 2045   BILIRUBINUR Negative 07/29/2023 1050   KETONESUR NEGATIVE 03/29/2014 2045   PROTEINUR Negative 07/29/2023 1050   PROTEINUR NEGATIVE 03/29/2014 2045   UROBILINOGEN 0.2 03/29/2014 2045   NITRITE Negative 07/29/2023 1050   NITRITE NEGATIVE 03/29/2014 2045   LEUKOCYTESUR Negative 07/29/2023 1050    Lab Results  Component  Value Date   LABMICR Comment 07/29/2023   WBCUA >30 (A) 04/15/2023   LABEPIT 0-10 04/15/2023   BACTERIA Moderate (A) 04/15/2023    Pertinent Imaging: N/a  Assessment & Plan:    1. BPH with obstruction/lower urinary tract symptoms (Primary) Continue tamsulosin .   - Urinalysis, Routine w reflex microscopic  2. ED - discussed sildenafil dosing.   No follow-ups on file.  Donnice Brooks, MD  [facility]  [address] [place], [place] [address] [phone]

## 2023-12-24 LAB — URINALYSIS, ROUTINE W REFLEX MICROSCOPIC
Bilirubin, UA: NEGATIVE
Glucose, UA: NEGATIVE
Ketones, UA: NEGATIVE
Nitrite, UA: NEGATIVE
Protein,UA: NEGATIVE
RBC, UA: NEGATIVE
Specific Gravity, UA: 1.015 (ref 1.005–1.030)
Urobilinogen, Ur: 1 mg/dL (ref 0.2–1.0)
pH, UA: 7.5 (ref 5.0–7.5)

## 2023-12-24 LAB — MICROSCOPIC EXAMINATION: Bacteria, UA: NONE SEEN

## 2023-12-30 ENCOUNTER — Other Ambulatory Visit

## 2023-12-30 DIAGNOSIS — N138 Other obstructive and reflux uropathy: Secondary | ICD-10-CM

## 2023-12-31 ENCOUNTER — Ambulatory Visit: Payer: Self-pay

## 2023-12-31 LAB — PSA: Prostate Specific Ag, Serum: 2.7 ng/mL (ref 0.0–4.0)

## 2024-03-29 IMAGING — CT CT CHEST LUNG CANCER SCREENING LOW DOSE W/O CM
2 of 4 series · 15 of 36 positions shown, 18 images · non-contrast
Comparison: 08/08/2020

CLINICAL DATA: 65-year-old male with 48 pack-year history of
smoking. Lung cancer screening.



[Series 4: lungs · axial · 0.69mm/px · z∈[+1395,+1675]mm · 12 of 308 slices shown, 15 images]
[im 14/308  mediastinal]
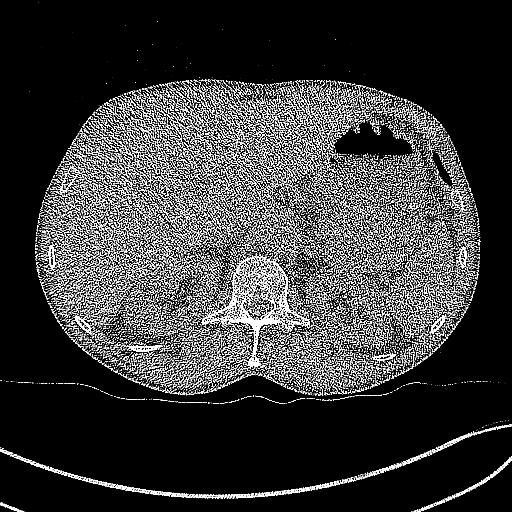
[im 14/308  lung]
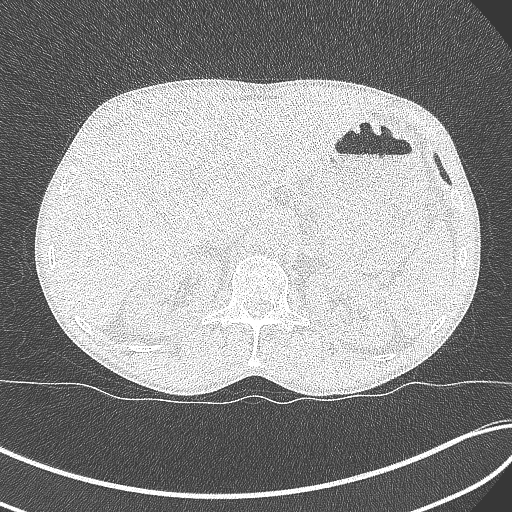
[im 42/308  lung]
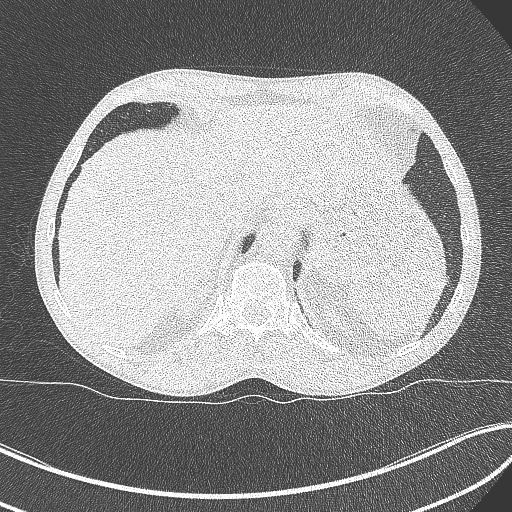
[im 70/308  lung]
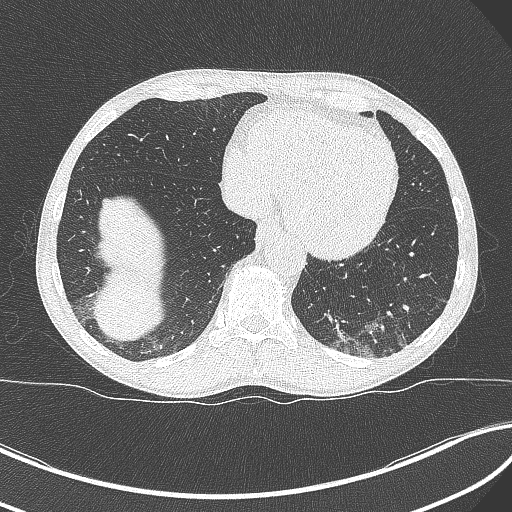
[im 98/308  lung]
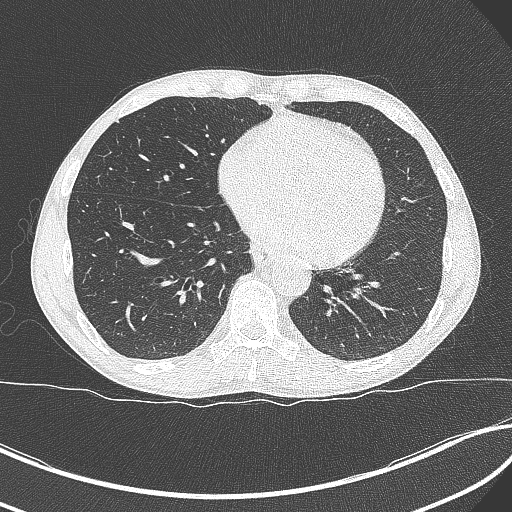
[im 112/308  mediastinal]
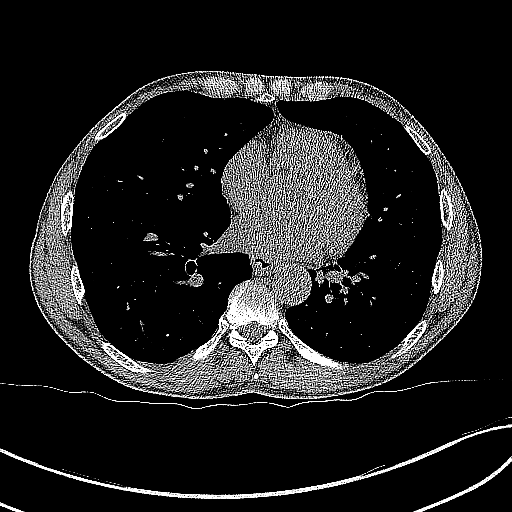
[im 112/308  lung]
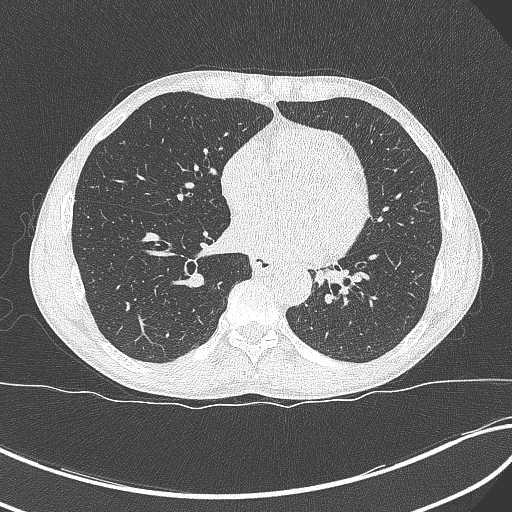
[im 140/308  lung]
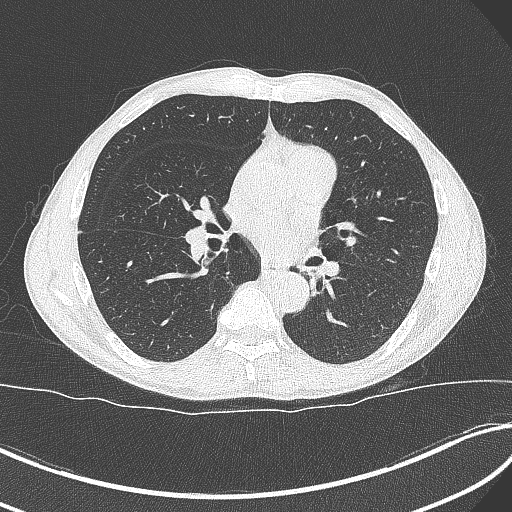
[im 168/308  lung]
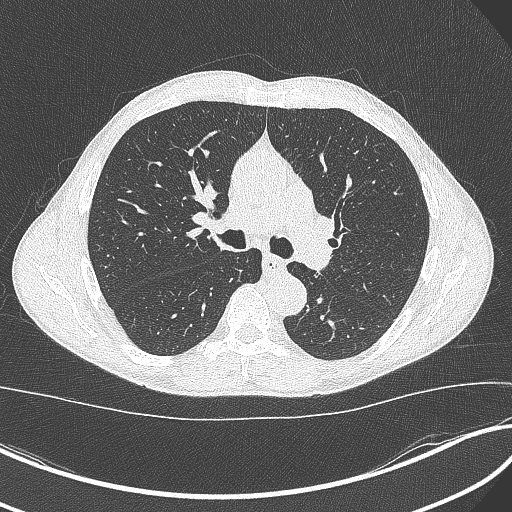
[im 196/308  lung]
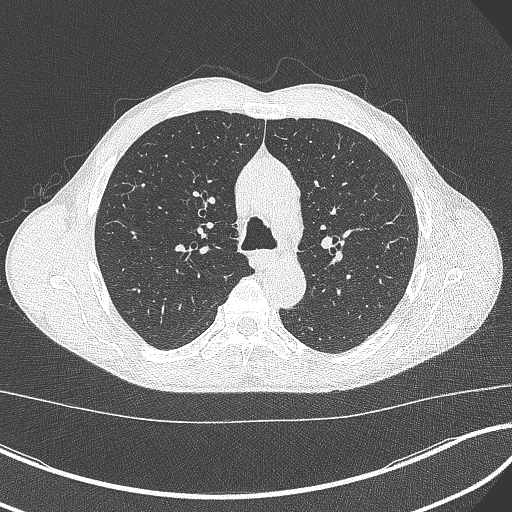
[im 210/308  mediastinal]
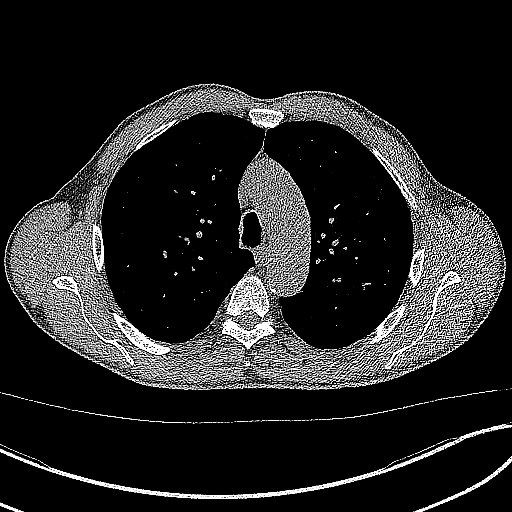
[im 210/308  lung]
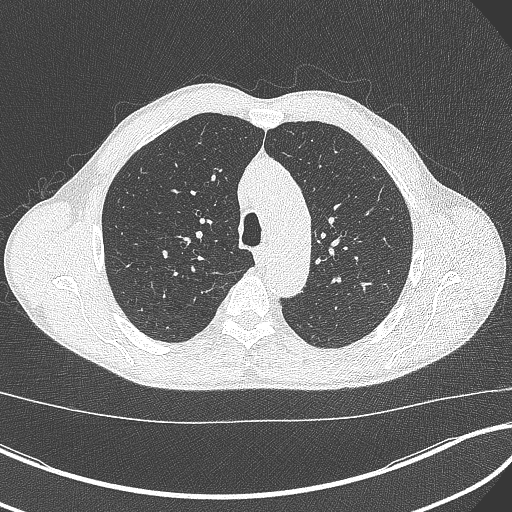
[im 238/308  lung]
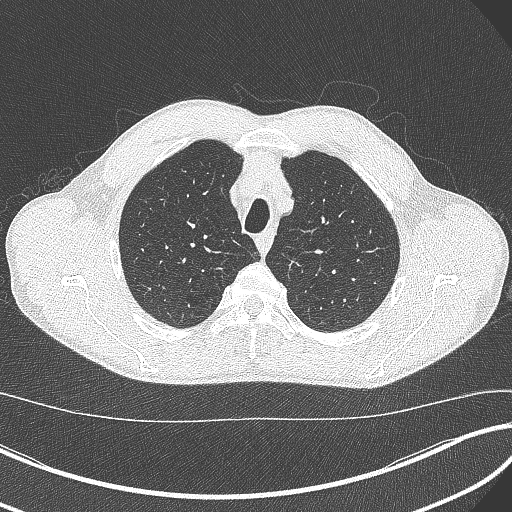
[im 266/308  lung]
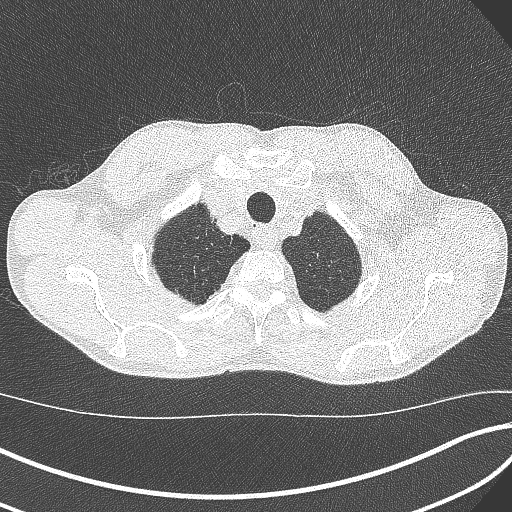
[im 294/308  lung]
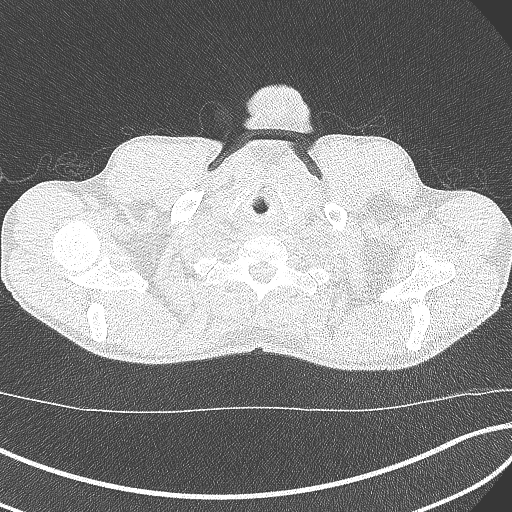

[Series 5: coronal · coronal · 0.62mm/px · 3 of 275 slices shown]
[im 55/275  lung]
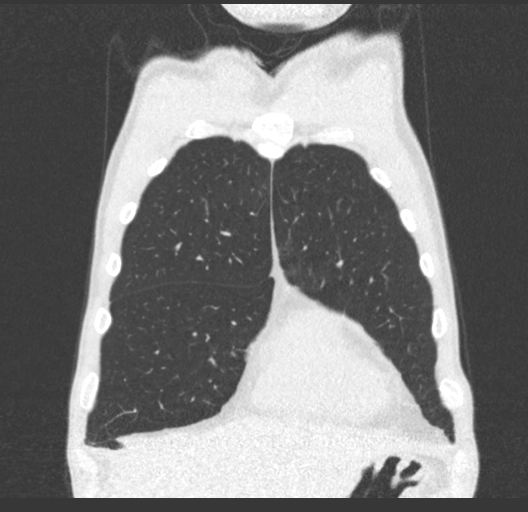
[im 110/275  lung]
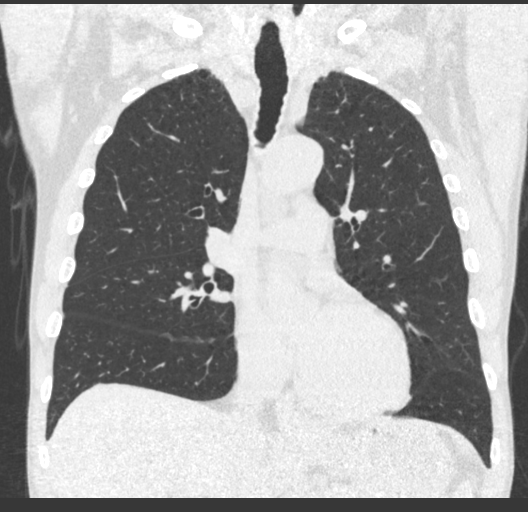
[im 165/275  lung]
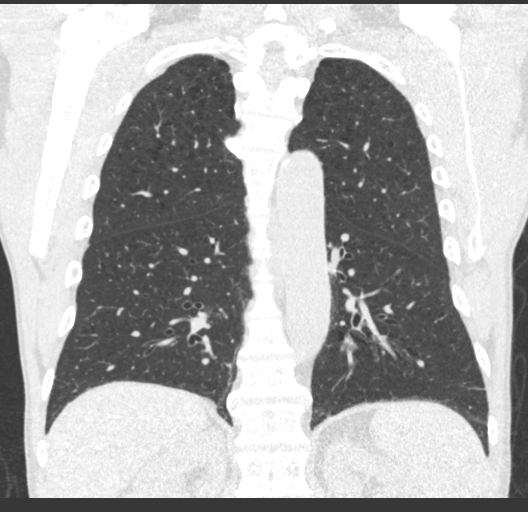

[15 of 36 positions shown; findings below may reference images not displayed]

FINDINGS: Cardiovascular: The heart size is normal. No substantial pericardial
effusion. Mild atherosclerotic calcification is noted in the wall of
the thoracic aorta.

Mediastinum/Nodes: No mediastinal lymphadenopathy. No evidence for
gross hilar lymphadenopathy although assessment is limited by the
lack of intravenous contrast on the current study. The esophagus has
normal imaging features. There is no axillary lymphadenopathy.

Lungs/Pleura: Centrilobular emphsyema noted. Tiny pulmonary nodules
identified previously are stable. No new suspicious pulmonary nodule
or mass. No focal airspace consolidation. No pleural effusion.

Upper Abdomen: Unremarkable.

Musculoskeletal: No worrisome lytic or sclerotic osseous
abnormality.
IMPRESSION: 1. Lung-RADS 2, benign appearance or behavior. Continue annual
screening with low-dose chest CT without contrast in 12 months.
2. Aortic Atherosclerosis (NZ4XT-TPX.X) and Emphysema (NZ4XT-KTB.J).

## 2024-04-03 ENCOUNTER — Encounter: Payer: Self-pay | Admitting: Cardiology

## 2024-04-03 ENCOUNTER — Ambulatory Visit: Attending: Cardiology | Admitting: Cardiology

## 2024-04-03 ENCOUNTER — Ambulatory Visit

## 2024-04-03 VITALS — BP 120/74 | HR 62 | Ht 69.0 in | Wt 132.0 lb

## 2024-04-03 DIAGNOSIS — R55 Syncope and collapse: Secondary | ICD-10-CM

## 2024-04-03 NOTE — Patient Instructions (Signed)
 Medication Instructions:  Your physician recommends that you continue on your current medications as directed. Please refer to the Current Medication list given to you today.  *If you need a refill on your cardiac medications before your next appointment, please call your pharmacy*  Lab Work: None If you have labs (blood work) drawn today and your tests are completely normal, you will receive your results only by: MyChart Message (if you have MyChart) OR A paper copy in the mail If you have any lab test that is abnormal or we need to change your treatment, we will call you to review the results.  Testing/Procedures: Your physician has requested that you have an echocardiogram. Echocardiography is a painless test that uses sound waves to create images of your heart. It provides your doctor with information about the size and shape of your heart and how well your hearts chambers and valves are working. This procedure takes approximately one hour. There are no restrictions for this procedure. Please do NOT wear cologne, perfume, aftershave, or lotions (deodorant is allowed). Please arrive 15 minutes prior to your appointment time.  Please note: We ask at that you not bring children with you during ultrasound (echo/ vascular) testing. Due to room size and safety concerns, children are not allowed in the ultrasound rooms during exams. Our front office staff cannot provide observation of children in our lobby area while testing is being conducted. An adult accompanying a patient to their appointment will only be allowed in the ultrasound room at the discretion of the ultrasound technician under special circumstances. We apologize for any inconvenience.   Follow-Up: At [facility], you and your health needs are our priority.  As part of our continuing mission to provide you with exceptional heart care, our providers are all part of one team.  This team includes your primary Cardiologist  (physician) and Advanced Practice Providers or APPs (Physician Assistants and Nurse Practitioners) who all work together to provide you with the care you need, when you need it.  Your next appointment:   8 week(s)  Provider:   You may one of the following Advanced Practice Providers on your designated Care Team:   Brittany Strader, PA-C  [place], [place] Olivia [place]avy, [place]     We recommend signing up for the patient portal called MyChart.  Sign up information is provided on this After Visit Summary.  MyChart is used to connect with patients for Virtual Visits (Telemedicine).  Patients are able to view lab/test results, encounter notes, upcoming appointments, etc.  Non-urgent messages can be sent to your provider as well.   To learn more about what you can do with MyChart, go to [email or web address].   Other Instructions ZIO XT- [place] Term Monitor Instructions   Your physician has requested you wear your ZIO patch monitor__14_____days.   This is a single patch monitor.  [employer] supplies one patch monitor per enrollment.  Additional stickers are not available.   Please do not apply patch if you will be having a Nuclear Stress Test, Echocardiogram, Cardiac CT, MRI, or Chest Xray during the time frame you would be wearing the monitor. The patch cannot be worn during these tests.  You cannot remove and re-apply the ZIO XT patch monitor.   Your ZIO patch monitor will be sent USPS Priority mail from [facility] directly to your home address. The monitor may also be mailed to a PO BOX if home delivery is not available.   It may take 3-5 days  to receive your monitor after you have been enrolled.   Once you have received you monitor, please review enclosed instructions.  Your monitor has already been registered assigning a specific monitor serial # to you.   Applying the monitor   Shave hair from upper left chest.   Hold abrader disc by orange tab.  Rub abrader in 40 strokes  over left upper chest as indicated in your monitor instructions.   Clean area with 4 enclosed alcohol pads .  Use all pads to assure are is cleaned thoroughly.  Let dry.   Apply patch as indicated in monitor instructions.  Patch will be place under collarbone on left side of chest with arrow pointing upward.   Rub patch adhesive wings for 2 minutes.Remove white label marked 1.  Remove white label marked 2.  Rub patch adhesive wings for 2 additional minutes.   While looking in a mirror, press and release button in center of patch.  A small green light will flash 3-4 times .  This will be your only indicator the monitor has been turned on.     Do not shower for the first 24 hours.  You may shower after the first 24 hours.   Press button if you feel a symptom. You will hear a small click.  Record Date, Time and Symptom in the Patient Log Book.   When you are ready to remove patch, follow instructions on last 2 pages of Patient Log Book.  Stick patch monitor onto last page of Patient Log Book.   Place Patient Log Book in [place] box.  Use locking tab on box and tape box closed securely.  The [employer] has [employer] on it.  Please place in mailbox as soon as possible.  Your physician should have your test results approximately 7 days after the monitor has been mailed back to [facility].   Call [facility] Customer Care at [phone] if you have questions regarding your ZIO XT patch monitor.  Call them immediately if you see an orange light blinking on your monitor.   If your monitor falls off in less than 4 days contact our Monitor department at [phone].  If your monitor becomes loose or falls off after 4 days call [employer] at [phone] for suggestions on securing your monitor.

## 2024-04-03 NOTE — Progress Notes (Addendum)
 "     Clinical Summary [name] is a 69 y.o.male seen today as a new consult, referred by Dr Katrinka for the following medical problems   1.Syncope - episode on Thanksgiving day, first and only episode - sitting on bar stool, started feeling warm. Started to take coat off as last thing he remebres, next thing he remebers his son was calling him. Family says eyes rolled, slumped down. Unclear he was out for. EMS came to evaluate of which they have some of the data - HR by vitals at 70, bp 72/52. BG 124. Rhythm strip sinus at 60 - some SOB at times with activities -since then low energy fatigue.   - staying well hydrated. Drinks beer 3 days a week, no heavy drinking day of event. Limited caffeine.  - followed by allergist, has had some food allergies in the past but he denies severe reactions. Had eaten sweet potatoes at Thanksgiving made from someone else.   Past Medical History:  Diagnosis Date   Asthma    Bronchitis    COPD (chronic obstructive pulmonary disease) (HCC)    GERD (gastroesophageal reflux disease)    Hypercholesteremia    Hypertension    S/P endoscopy July 21, 2010   Dr. Shaaron: limited endoscopy, secondary to food impaction, Schatzki's ring seen     Allergies[1]   Current Outpatient Medications  Medication Sig Dispense Refill   albuterol  (PROVENTIL ) (2.5 MG/3ML) 0.083% nebulizer solution Take 3 mLs (2.5 mg total) by nebulization every 6 (six) hours as needed. For shortness of breath and/or wheezing 75 mL 0   albuterol  (VENTOLIN  HFA) 108 (90 Base) MCG/ACT inhaler Inhale 1 puff into the lungs every 6 (six) hours as needed for wheezing or shortness of breath.     amLODipine (NORVASC) 5 MG tablet Take 5 mg by mouth daily.     azelastine (OPTIVAR) 0.05 % ophthalmic solution Place 1 drop into both eyes 2 (two) times daily.     cephALEXin  (KEFLEX ) 500 MG capsule Take 1 capsule (500 mg total) by mouth 2 (two) times daily. (Patient not taking: Reported on 07/29/2023) 10  capsule 0   EPINEPHrine 0.3 mg/0.3 mL IJ SOAJ injection Inject 0.3 mg into the muscle as needed for anaphylaxis.     Fluticasone -Umeclidin-Vilant (TRELEGY ELLIPTA) 200-62.5-25 MCG/INH AEPB Inhale 1 puff into the lungs daily.     nitrofurantoin , macrocrystal-monohydrate, (MACROBID ) 100 MG capsule Take 100 mg twice a day for 5 days and the one nightly to prevent UTI 90 capsule 0   OVER THE COUNTER MEDICATION Allergy shots two shots Q Wednesday per patient for allergy to grass, cockroaches,dust mites, and trees. (Patient not taking: Reported on 07/29/2023)     oxyCODONE  (ROXICODONE ) 5 MG immediate release tablet Take 1 tablet (5 mg total) by mouth every 6 (six) hours as needed. (Patient not taking: Reported on 07/29/2023) 4 tablet 0   oxymetazoline (AFRIN) 0.05 % nasal spray Place 1 spray into both nostrils 2 (two) times daily as needed for congestion.     sildenafil (VIAGRA) 50 MG tablet Take 50 mg by mouth daily as needed for erectile dysfunction.     tamsulosin  (FLOMAX ) 0.4 MG CAPS capsule Take 1 capsule (0.4 mg total) by mouth daily after supper. 90 capsule 3   No current facility-administered medications for this visit.     Past Surgical History:  Procedure Laterality Date   BIOPSY  06/28/2020   Procedure: BIOPSY;  Surgeon: Eartha Angelia Sieving, MD;  [facility];  Service: Gastroenterology;;   COLONOSCOPY N/A 03/22/2016   Procedure: COLONOSCOPY;  Surgeon: Claudis RAYMOND Rivet, MD;  [facility];  Service: Endoscopy;  Laterality: N/A;  830   COLONOSCOPY N/A 07/08/2019   Procedure: COLONOSCOPY;  Surgeon: Rivet Claudis RAYMOND, MD;  [facility];  Service: Endoscopy;  Laterality: N/A;  1200   CYST REMOVAL NECK N/A 03/08/2023   Procedure: CYST REMOVAL NECK, POSTERIOR;  Surgeon: Evonnie Dorothyann LABOR, DO;  Location: [facility];  Service: General;  Laterality: N/A;   ESOPHAGOGASTRODUODENOSCOPY (EGD) WITH PROPOFOL  N/A 06/28/2020   Procedure: ESOPHAGOGASTRODUODENOSCOPY (EGD) WITH  PROPOFOL ;  Surgeon: Eartha Angelia Sieving, MD;  [facility];  Service: Gastroenterology;  Laterality: N/A;  PM   lipoma removal from posterior neck     MASS EXCISION Right 07/19/2021   Procedure: EXCISION MASS RIGHT POSTERIOR EAR;  Surgeon: Evonnie Dorothyann LABOR, DO;  Location: [facility];  Service: General;  Laterality: Right;   MASS EXCISION Left 03/08/2023   Procedure: EXCISION MASS, SHOULDER;  Surgeon: Evonnie Dorothyann LABOR, DO;  Location: [facility];  Service: General;  Laterality: Left;     Allergies[2]    Family History  Problem Relation Age of Onset   Bone cancer Mother        deceased in late 6s   Aneurysm Father        deceased late 21s, brain   Cancer Brother    COPD Sister    Colon cancer Neg Hx      Social History [name] reports that he quit smoking about 7 years ago. His smoking use included cigarettes. He smoked an average of 0.5 packs per day. He quit smokeless tobacco use about 14 years ago. [name] reports that he does not currently use alcohol.     Physical Examination Today's Vitals   04/03/24 1537  BP: 120/74  Pulse: 62  SpO2: 96%  Weight: 132 lb (59.9 kg)  Height: 5' 9 (1.753 m)   Body mass index is 19.49 kg/m.  Gen: resting comfortably, no acute distress HEENT: no scleral icterus, pupils equal round and reactive, no palptable cervical adenopathy,  CV: RRR, no mrg, no jvd Resp: Clear to auscultation bilaterally GI: abdomen is soft, non-tender, non-distended, normal bowel sounds, no hepatosplenomegaly MSK: extremities are warm, no edema.  Skin: warm, no rash Neuro:  no focal deficits Psych: appropriate affect      Assessment and Plan  1.Syncope  - unclear etiology - will plan for echo, heart monitor - if benign may have been vasovagal syncope given he felt hot prior, low bp's at time of EMS evaluatin - other potential would be some type of vasodilatory reaction as a part of his prior food allergies, though he reports  these are currently mild, he did eat from a dish that was brought by an outisde family member. Has epi pen but has never had to use it  EKG today shows NSR     Dorn PHEBE Ross, M.D.     [1]  Allergies Allergen Reactions   American Cockroach Itching   Corn-Containing Products Itching   Dust Mite Extract Itching   Grass Pollen(K-O-R-T-Swt Vern) Itching   Peanut-Containing Drug Products Itching    Itching and Whelps   Pollen Extract-Tree Extract [Pollen Extract] Itching  [2]  Allergies Allergen Reactions   American Cockroach Itching   Corn-Containing Products Itching   Dust Mite Extract Itching   Grass Pollen(K-O-R-T-Swt Vern) Itching   Peanut-Containing Drug Products Itching    Itching  and Whelps   Pollen Extract-Tree Extract [Pollen Extract] Itching   "

## 2024-04-09 ENCOUNTER — Telehealth: Payer: Self-pay | Admitting: Cardiology

## 2024-04-09 NOTE — Telephone Encounter (Signed)
 Patient says he was advised to go to the office when he receives heart monitor for assistance with placing it. He says he plans to go to the office soon--FYI.

## 2024-04-09 NOTE — Telephone Encounter (Signed)
 Nurse apt made for 04/13/24 at 3:45 pm to apply Zio

## 2024-04-13 ENCOUNTER — Ambulatory Visit: Attending: Internal Medicine | Admitting: *Deleted

## 2024-04-13 NOTE — Progress Notes (Signed)
Zio monitor applied

## 2024-04-29 ENCOUNTER — Ambulatory Visit: Admitting: Cardiology

## 2024-05-06 ENCOUNTER — Ambulatory Visit (HOSPITAL_COMMUNITY)

## 2024-06-01 ENCOUNTER — Ambulatory Visit: Admitting: Physician Assistant

## 2024-12-28 ENCOUNTER — Ambulatory Visit: Admitting: Urology
